# Patient Record
Sex: Female | Born: 1950
Health system: Southern US, Community
[De-identification: ages and names within clinical notes are randomized; demographics above are authoritative.]

## PROBLEM LIST (undated history)

## (undated) DIAGNOSIS — C50919 Malignant neoplasm of unspecified site of unspecified female breast: Secondary | ICD-10-CM

## (undated) DIAGNOSIS — S82132A Displaced fracture of medial condyle of left tibia, initial encounter for closed fracture: Secondary | ICD-10-CM

## (undated) DIAGNOSIS — M1990 Unspecified osteoarthritis, unspecified site: Secondary | ICD-10-CM

## (undated) DIAGNOSIS — I10 Essential (primary) hypertension: Secondary | ICD-10-CM

## (undated) DIAGNOSIS — Z972 Presence of dental prosthetic device (complete) (partial): Secondary | ICD-10-CM

## (undated) DIAGNOSIS — J449 Chronic obstructive pulmonary disease, unspecified: Secondary | ICD-10-CM

## (undated) DIAGNOSIS — K219 Gastro-esophageal reflux disease without esophagitis: Secondary | ICD-10-CM

## (undated) DIAGNOSIS — C801 Malignant (primary) neoplasm, unspecified: Secondary | ICD-10-CM

## (undated) DIAGNOSIS — E7800 Pure hypercholesterolemia, unspecified: Secondary | ICD-10-CM

## (undated) DIAGNOSIS — T8859XA Other complications of anesthesia, initial encounter: Secondary | ICD-10-CM

## (undated) HISTORY — PX: COLONOSCOPY: SHX174

## (undated) HISTORY — PX: CARPAL TUNNEL RELEASE: SHX101

## (undated) HISTORY — PX: TUBAL LIGATION: SHX77

## (undated) HISTORY — PX: THUMB FUSION: SUR636

---

## 1991-09-22 DIAGNOSIS — C50912 Malignant neoplasm of unspecified site of left female breast: Secondary | ICD-10-CM

## 1991-09-22 DIAGNOSIS — C801 Malignant (primary) neoplasm, unspecified: Secondary | ICD-10-CM

## 1991-09-22 HISTORY — PX: MASTECTOMY: SHX3

## 1991-09-22 HISTORY — DX: Malignant neoplasm of unspecified site of left female breast: C50.912

## 1991-09-22 HISTORY — DX: Malignant (primary) neoplasm, unspecified: C80.1

## 2004-07-16 ENCOUNTER — Ambulatory Visit: Payer: Self-pay | Admitting: Internal Medicine

## 2005-08-17 ENCOUNTER — Ambulatory Visit: Payer: Self-pay | Admitting: Internal Medicine

## 2006-08-20 ENCOUNTER — Ambulatory Visit: Payer: Self-pay | Admitting: Internal Medicine

## 2006-10-01 ENCOUNTER — Ambulatory Visit: Payer: Self-pay | Admitting: Gastroenterology

## 2007-08-17 ENCOUNTER — Ambulatory Visit: Payer: Self-pay | Admitting: Internal Medicine

## 2007-08-31 ENCOUNTER — Ambulatory Visit: Payer: Self-pay | Admitting: Internal Medicine

## 2007-10-17 ENCOUNTER — Ambulatory Visit: Payer: Self-pay | Admitting: Internal Medicine

## 2008-09-03 ENCOUNTER — Ambulatory Visit: Payer: Self-pay | Admitting: Internal Medicine

## 2009-10-16 ENCOUNTER — Ambulatory Visit: Payer: Self-pay | Admitting: Internal Medicine

## 2010-10-20 ENCOUNTER — Ambulatory Visit: Payer: Self-pay | Admitting: Internal Medicine

## 2011-09-02 ENCOUNTER — Ambulatory Visit: Payer: Self-pay | Admitting: Unknown Physician Specialty

## 2011-09-05 ENCOUNTER — Ambulatory Visit: Payer: Self-pay | Admitting: Unknown Physician Specialty

## 2011-10-22 ENCOUNTER — Ambulatory Visit: Payer: Self-pay | Admitting: Internal Medicine

## 2012-10-24 ENCOUNTER — Ambulatory Visit: Payer: Self-pay | Admitting: Internal Medicine

## 2013-06-15 ENCOUNTER — Ambulatory Visit: Payer: Self-pay | Admitting: Internal Medicine

## 2013-06-30 ENCOUNTER — Ambulatory Visit: Payer: Self-pay | Admitting: Gastroenterology

## 2013-10-25 ENCOUNTER — Ambulatory Visit: Payer: Self-pay | Admitting: Internal Medicine

## 2014-03-26 DIAGNOSIS — J449 Chronic obstructive pulmonary disease, unspecified: Secondary | ICD-10-CM | POA: Insufficient documentation

## 2014-10-26 ENCOUNTER — Ambulatory Visit: Payer: Self-pay | Admitting: Internal Medicine

## 2015-08-21 ENCOUNTER — Other Ambulatory Visit: Payer: Self-pay | Admitting: Internal Medicine

## 2015-08-21 DIAGNOSIS — Z1231 Encounter for screening mammogram for malignant neoplasm of breast: Secondary | ICD-10-CM

## 2015-10-28 ENCOUNTER — Other Ambulatory Visit: Payer: Self-pay | Admitting: Internal Medicine

## 2015-10-28 ENCOUNTER — Ambulatory Visit
Admission: RE | Admit: 2015-10-28 | Discharge: 2015-10-28 | Disposition: A | Discharge status: Home / Self Care | Payer: BLUE CROSS/BLUE SHIELD | Source: Ambulatory Visit | Attending: Internal Medicine | Admitting: Internal Medicine

## 2015-10-28 DIAGNOSIS — Z1231 Encounter for screening mammogram for malignant neoplasm of breast: Secondary | ICD-10-CM

## 2015-10-28 HISTORY — DX: Malignant neoplasm of unspecified site of unspecified female breast: C50.919

## 2015-10-28 HISTORY — DX: Malignant (primary) neoplasm, unspecified: C80.1

## 2016-01-17 ENCOUNTER — Encounter: Payer: Self-pay | Admitting: *Deleted

## 2016-01-17 NOTE — Discharge Instructions (Signed)

## 2016-01-22 ENCOUNTER — Ambulatory Visit
Admission: RE | Admit: 2016-01-22 | Discharge: 2016-01-22 | Disposition: A | Discharge status: Home / Self Care | Payer: BLUE CROSS/BLUE SHIELD | Source: Ambulatory Visit | Attending: Ophthalmology | Admitting: Ophthalmology

## 2016-01-22 ENCOUNTER — Ambulatory Visit: Payer: BLUE CROSS/BLUE SHIELD | Admitting: Anesthesiology

## 2016-01-22 ENCOUNTER — Encounter
Admission: RE | Disposition: A | Discharge status: Home / Self Care | Payer: Self-pay | Source: Ambulatory Visit | Attending: Ophthalmology

## 2016-01-22 DIAGNOSIS — K219 Gastro-esophageal reflux disease without esophagitis: Secondary | ICD-10-CM | POA: Insufficient documentation

## 2016-01-22 DIAGNOSIS — Z79899 Other long term (current) drug therapy: Secondary | ICD-10-CM | POA: Diagnosis not present

## 2016-01-22 DIAGNOSIS — Z87891 Personal history of nicotine dependence: Secondary | ICD-10-CM | POA: Insufficient documentation

## 2016-01-22 DIAGNOSIS — E7800 Pure hypercholesterolemia, unspecified: Secondary | ICD-10-CM | POA: Diagnosis not present

## 2016-01-22 DIAGNOSIS — H2511 Age-related nuclear cataract, right eye: Secondary | ICD-10-CM | POA: Diagnosis present

## 2016-01-22 DIAGNOSIS — Z853 Personal history of malignant neoplasm of breast: Secondary | ICD-10-CM | POA: Insufficient documentation

## 2016-01-22 DIAGNOSIS — J449 Chronic obstructive pulmonary disease, unspecified: Secondary | ICD-10-CM | POA: Insufficient documentation

## 2016-01-22 DIAGNOSIS — M1990 Unspecified osteoarthritis, unspecified site: Secondary | ICD-10-CM | POA: Diagnosis not present

## 2016-01-22 DIAGNOSIS — I10 Essential (primary) hypertension: Secondary | ICD-10-CM | POA: Insufficient documentation

## 2016-01-22 DIAGNOSIS — M810 Age-related osteoporosis without current pathological fracture: Secondary | ICD-10-CM | POA: Diagnosis not present

## 2016-01-22 DIAGNOSIS — Z888 Allergy status to other drugs, medicaments and biological substances status: Secondary | ICD-10-CM | POA: Insufficient documentation

## 2016-01-22 HISTORY — DX: Essential (primary) hypertension: I10

## 2016-01-22 HISTORY — DX: Presence of dental prosthetic device (complete) (partial): Z97.2

## 2016-01-22 HISTORY — DX: Unspecified osteoarthritis, unspecified site: M19.90

## 2016-01-22 HISTORY — DX: Pure hypercholesterolemia, unspecified: E78.00

## 2016-01-22 HISTORY — PX: CATARACT EXTRACTION W/PHACO: SHX586

## 2016-01-22 HISTORY — DX: Chronic obstructive pulmonary disease, unspecified: J44.9

## 2016-01-22 HISTORY — DX: Gastro-esophageal reflux disease without esophagitis: K21.9

## 2016-01-22 SURGERY — PHACOEMULSIFICATION, CATARACT, WITH IOL INSERTION
Anesthesia: Monitor Anesthesia Care | Site: Eye | Laterality: Right | Wound class: Clean

## 2016-01-22 MED ORDER — BSS IO SOLN
INTRAOCULAR | Status: DC | PRN
  Administered 2016-01-22 (×2): 500 mL via INTRAOCULAR

## 2016-01-22 MED ORDER — LIDOCAINE HCL (PF) 4 % IJ SOLN
INTRAOCULAR | Status: DC | PRN
  Administered 2016-01-22: 1 mL via OPHTHALMIC

## 2016-01-22 MED ORDER — POVIDONE-IODINE 5 % OP SOLN
1.0000 "application " | OPHTHALMIC | Status: DC | PRN
  Administered 2016-01-22: 1 via OPHTHALMIC

## 2016-01-22 MED ORDER — TIMOLOL MALEATE 0.5 % OP SOLN
OPHTHALMIC | Status: DC | PRN
  Administered 2016-01-22: 1 [drp] via OPHTHALMIC

## 2016-01-22 MED ORDER — ACETAMINOPHEN 160 MG/5ML PO SOLN: 325.0000 mg | ORAL | Status: DC | PRN

## 2016-01-22 MED ORDER — FENTANYL CITRATE (PF) 100 MCG/2ML IJ SOLN
INTRAMUSCULAR | Status: DC | PRN
  Administered 2016-01-22: 50 ug via INTRAVENOUS

## 2016-01-22 MED ORDER — ARMC OPHTHALMIC DILATING GEL
1.0000 "application " | OPHTHALMIC | Status: DC | PRN
  Administered 2016-01-22 (×2): 1 via OPHTHALMIC

## 2016-01-22 MED ORDER — MIDAZOLAM HCL 2 MG/2ML IJ SOLN
INTRAMUSCULAR | Status: DC | PRN
  Administered 2016-01-22: 2 mg via INTRAVENOUS

## 2016-01-22 MED ORDER — BRIMONIDINE TARTRATE 0.2 % OP SOLN
OPHTHALMIC | Status: DC | PRN
  Administered 2016-01-22: 1 [drp] via OPHTHALMIC

## 2016-01-22 MED ORDER — ACETAMINOPHEN 325 MG PO TABS: 325.0000 mg | ORAL_TABLET | ORAL | Status: DC | PRN

## 2016-01-22 MED ORDER — TETRACAINE HCL 0.5 % OP SOLN
1.0000 [drp] | OPHTHALMIC | Status: DC | PRN
  Administered 2016-01-22: 1 [drp] via OPHTHALMIC

## 2016-01-22 MED ORDER — LACTATED RINGERS IV SOLN: INTRAVENOUS | Status: DC

## 2016-01-22 MED ORDER — CEFUROXIME OPHTHALMIC INJECTION 1 MG/0.1 ML
INJECTION | OPHTHALMIC | Status: DC | PRN
  Administered 2016-01-22: 0.1 mL via INTRACAMERAL

## 2016-01-22 SURGICAL SUPPLY — 21 items
CANNULA ANT/CHMB 27GA (MISCELLANEOUS) ×3 IMPLANT
CARTRIDGE ABBOTT (MISCELLANEOUS) IMPLANT
GLOVE SURG LX 7.5 STRW (GLOVE) ×2
GLOVE SURG LX STRL 7.5 STRW (GLOVE) ×1 IMPLANT
GLOVE SURG TRIUMPH 8.0 PF LTX (GLOVE) ×3 IMPLANT
GOWN STRL REUS W/ TWL LRG LVL3 (GOWN DISPOSABLE) ×2 IMPLANT
GOWN STRL REUS W/TWL LRG LVL3 (GOWN DISPOSABLE) ×4
LENS IOL TECNIS ITEC 19.0 (Intraocular Lens) ×3 IMPLANT
MARKER SKIN DUAL TIP RULER LAB (MISCELLANEOUS) ×3 IMPLANT
NDL RETROBULBAR .5 NSTRL (NEEDLE) IMPLANT
PACK CATARACT BRASINGTON (MISCELLANEOUS) ×3 IMPLANT
PACK EYE AFTER SURG (MISCELLANEOUS) ×3 IMPLANT
PACK OPTHALMIC (MISCELLANEOUS) ×3 IMPLANT
RING MALYGIN 7.0 (MISCELLANEOUS) IMPLANT
SUT ETHILON 10-0 CS-B-6CS-B-6 (SUTURE)
SUT VICRYL  9 0 (SUTURE)
SUT VICRYL 9 0 (SUTURE) IMPLANT
SUTURE EHLN 10-0 CS-B-6CS-B-6 (SUTURE) IMPLANT
SYR TB 1ML LUER SLIP (SYRINGE) ×3 IMPLANT
WATER STERILE IRR 250ML POUR (IV SOLUTION) ×3 IMPLANT
WIPE NON LINTING 3.25X3.25 (MISCELLANEOUS) ×3 IMPLANT

## 2016-01-22 NOTE — Anesthesia Postprocedure Evaluation (Signed)
Anesthesia Post Note  Patient: Lindsey Mccall  Procedure(s) Performed: Procedure(s) (LRB): CATARACT EXTRACTION PHACO AND INTRAOCULAR LENS PLACEMENT (IOC) right eye (Right)  Patient location during evaluation: PACU Anesthesia Type: MAC Level of consciousness: awake and alert and oriented Pain management: satisfactory to patient Vital Signs Assessment: post-procedure vital signs reviewed and stable Respiratory status: spontaneous breathing, nonlabored ventilation and respiratory function stable Cardiovascular status: blood pressure returned to baseline and stable Postop Assessment: Adequate PO intake and No signs of nausea or vomiting Anesthetic complications: no    Raliegh Ip

## 2016-01-22 NOTE — Anesthesia Procedure Notes (Signed)
Procedure Name: MAC Performed by: Jonesha Tsuchiya Pre-anesthesia Checklist: Patient identified, Emergency Drugs available, Suction available, Timeout performed and Patient being monitored Patient Re-evaluated:Patient Re-evaluated prior to inductionOxygen Delivery Method: Nasal cannula Placement Confirmation: positive ETCO2     

## 2016-01-22 NOTE — Transfer of Care (Signed)
Immediate Anesthesia Transfer of Care Note  Patient: Lindsey Mccall  Procedure(s) Performed: Procedure(s): CATARACT EXTRACTION PHACO AND INTRAOCULAR LENS PLACEMENT (IOC) right eye (Right)  Patient Location: PACU  Anesthesia Type: MAC  Level of Consciousness: awake, alert  and patient cooperative  Airway and Oxygen Therapy: Patient Spontanous Breathing and Patient connected to supplemental oxygen  Post-op Assessment: Post-op Vital signs reviewed, Patient's Cardiovascular Status Stable, Respiratory Function Stable, Patent Airway and No signs of Nausea or vomiting  Post-op Vital Signs: Reviewed and stable  Complications: No apparent anesthesia complications

## 2016-01-22 NOTE — Anesthesia Preprocedure Evaluation (Signed)
Anesthesia Evaluation  Patient identified by MRN, date of birth, ID band  Reviewed: Allergy & Precautions, H&P , NPO status , Patient's Chart, lab work & pertinent test results  Airway Mallampati: II  TM Distance: >3 FB Neck ROM: full    Dental no notable dental hx. (+) Upper Dentures   Pulmonary neg shortness of breath, COPD, former smoker,    Pulmonary exam normal        Cardiovascular hypertension,  Rhythm:regular Rate:Normal     Neuro/Psych    GI/Hepatic GERD  ,  Endo/Other    Renal/GU      Musculoskeletal   Abdominal   Peds  Hematology   Anesthesia Other Findings   Reproductive/Obstetrics                             Anesthesia Physical Anesthesia Plan  ASA: II  Anesthesia Plan: MAC   Post-op Pain Management:    Induction:   Airway Management Planned:   Additional Equipment:   Intra-op Plan:   Post-operative Plan:   Informed Consent: I have reviewed the patients History and Physical, chart, labs and discussed the procedure including the risks, benefits and alternatives for the proposed anesthesia with the patient or authorized representative who has indicated his/her understanding and acceptance.     Plan Discussed with: CRNA  Anesthesia Plan Comments:         Anesthesia Quick Evaluation

## 2016-01-22 NOTE — Op Note (Signed)
LOCATION:  [facility]   PREOPERATIVE DIAGNOSIS:    Nuclear sclerotic cataract right eye. H25.11   POSTOPERATIVE DIAGNOSIS:  Nuclear sclerotic cataract right eye.     PROCEDURE:  Phacoemusification with posterior chamber intraocular lens placement of the right eye   LENS:   Implant Name Type Inv. Item Serial No. Manufacturer Lot No. LRB No. Used  LENS IOL DIOP 19.0 - [id number] Intraocular Lens LENS IOL DIOP 19.0 [id number]   Right 1        ULTRASOUND TIME: 16 % of 1 minutes, 12 seconds.  CDE 11.9   SURGEON:  Wyonia Hough, MD   ANESTHESIA:  Topical with tetracaine drops and 2% Xylocaine jelly, augmented with 1% preservative-free intracameral lidocaine.    COMPLICATIONS:  None.   DESCRIPTION OF PROCEDURE:  The patient was identified in the holding room and transported to the operating room and placed in the supine position under the operating microscope.  The right eye was identified as the operative eye and it was prepped and draped in the usual sterile ophthalmic fashion.   A 1 millimeter clear-corneal paracentesis was made at the 12:00 position.  0.5 ml of preservative-free 1% lidocaine was injected into the anterior chamber. The anterior chamber was filled with Viscoat viscoelastic.  A 2.4 millimeter keratome was used to make a near-clear corneal incision at the 9:00 position.  A curvilinear capsulorrhexis was made with a cystotome and capsulorrhexis forceps.  Balanced salt solution was used to hydrodissect and hydrodelineate the nucleus.   Phacoemulsification was then used in stop and chop fashion to remove the lens nucleus and epinucleus.  The remaining cortex was then removed using the irrigation and aspiration handpiece. Provisc was then placed into the capsular bag to distend it for lens placement.  A lens was then injected into the capsular bag.  The remaining viscoelastic was aspirated.   Wounds were hydrated with balanced salt solution.  The anterior  chamber was inflated to a physiologic pressure with balanced salt solution.  No wound leaks were noted. Cefuroxime 0.1 ml of a 10mg /ml solution was injected into the anterior chamber for a dose of 1 mg of intracameral antibiotic at the completion of the case.   Timolol and Brimonidine drops were applied to the eye.  The patient was taken to the recovery room in stable condition without complications of anesthesia or surgery.   Decari Duggar 01/22/2016, 11:51 AM

## 2016-01-22 NOTE — H&P (Signed)
  The History and Physical notes are on paper, have been signed, and are to be scanned. The patient remains stable and unchanged from the H&P.   Previous H&P reviewed, patient examined, and there are no changes.  Lindsey Mccall 01/22/2016 11:03 AM

## 2016-01-23 ENCOUNTER — Encounter: Payer: Self-pay | Admitting: Ophthalmology

## 2016-10-07 ENCOUNTER — Other Ambulatory Visit: Payer: Self-pay | Admitting: Internal Medicine

## 2016-10-07 DIAGNOSIS — Z1231 Encounter for screening mammogram for malignant neoplasm of breast: Secondary | ICD-10-CM

## 2016-11-05 ENCOUNTER — Other Ambulatory Visit: Payer: Self-pay | Admitting: Internal Medicine

## 2016-11-05 DIAGNOSIS — R05 Cough: Secondary | ICD-10-CM

## 2016-11-05 DIAGNOSIS — R053 Chronic cough: Secondary | ICD-10-CM

## 2016-11-09 ENCOUNTER — Ambulatory Visit
Admission: RE | Admit: 2016-11-09 | Discharge: 2016-11-09 | Disposition: A | Discharge status: Home / Self Care | Payer: BLUE CROSS/BLUE SHIELD | Source: Ambulatory Visit | Attending: Internal Medicine | Admitting: Internal Medicine

## 2016-11-09 DIAGNOSIS — Z1231 Encounter for screening mammogram for malignant neoplasm of breast: Secondary | ICD-10-CM | POA: Insufficient documentation

## 2016-11-17 ENCOUNTER — Ambulatory Visit
Admission: RE | Admit: 2016-11-17 | Discharge: 2016-11-17 | Disposition: A | Discharge status: Home / Self Care | Payer: BLUE CROSS/BLUE SHIELD | Source: Ambulatory Visit | Attending: Internal Medicine | Admitting: Internal Medicine

## 2016-11-17 DIAGNOSIS — R918 Other nonspecific abnormal finding of lung field: Secondary | ICD-10-CM | POA: Diagnosis not present

## 2016-11-17 DIAGNOSIS — J439 Emphysema, unspecified: Secondary | ICD-10-CM | POA: Diagnosis not present

## 2016-11-17 DIAGNOSIS — R05 Cough: Secondary | ICD-10-CM

## 2016-11-17 DIAGNOSIS — R053 Chronic cough: Secondary | ICD-10-CM

## 2017-05-05 ENCOUNTER — Other Ambulatory Visit: Payer: Self-pay | Admitting: Internal Medicine

## 2017-05-05 DIAGNOSIS — R911 Solitary pulmonary nodule: Secondary | ICD-10-CM

## 2017-05-12 ENCOUNTER — Ambulatory Visit
Admission: RE | Admit: 2017-05-12 | Discharge: 2017-05-12 | Disposition: A | Discharge status: Home / Self Care | Payer: BLUE CROSS/BLUE SHIELD | Source: Ambulatory Visit | Attending: Internal Medicine | Admitting: Internal Medicine

## 2017-05-12 DIAGNOSIS — R918 Other nonspecific abnormal finding of lung field: Secondary | ICD-10-CM | POA: Diagnosis not present

## 2017-05-12 DIAGNOSIS — D3501 Benign neoplasm of right adrenal gland: Secondary | ICD-10-CM | POA: Diagnosis not present

## 2017-05-12 DIAGNOSIS — R911 Solitary pulmonary nodule: Secondary | ICD-10-CM | POA: Diagnosis present

## 2017-05-12 DIAGNOSIS — D3502 Benign neoplasm of left adrenal gland: Secondary | ICD-10-CM | POA: Diagnosis not present

## 2017-05-17 ENCOUNTER — Other Ambulatory Visit: Payer: Self-pay | Admitting: Internal Medicine

## 2017-05-17 DIAGNOSIS — R918 Other nonspecific abnormal finding of lung field: Secondary | ICD-10-CM

## 2017-05-21 ENCOUNTER — Encounter: Payer: Self-pay | Admitting: *Deleted

## 2017-05-21 ENCOUNTER — Ambulatory Visit (INDEPENDENT_AMBULATORY_CARE_PROVIDER_SITE_OTHER): Payer: BLUE CROSS/BLUE SHIELD | Admitting: Pulmonary Disease

## 2017-05-21 ENCOUNTER — Encounter: Payer: Self-pay | Admitting: Pulmonary Disease

## 2017-05-21 VITALS — BP 126/88 | HR 96 | Ht 61.0 in | Wt 107.0 lb

## 2017-05-21 DIAGNOSIS — Z87891 Personal history of nicotine dependence: Secondary | ICD-10-CM | POA: Diagnosis not present

## 2017-05-21 DIAGNOSIS — R918 Other nonspecific abnormal finding of lung field: Secondary | ICD-10-CM

## 2017-05-21 DIAGNOSIS — J439 Emphysema, unspecified: Secondary | ICD-10-CM

## 2017-05-21 NOTE — Patient Instructions (Signed)
Repeat CT chest in 3 months and follow-up with me after that to review and decide further management

## 2017-05-25 NOTE — Progress Notes (Signed)
PULMONARY CONSULT NOTE  Requesting MD/Service: Doy Hutching Date of initial consultation: 05/21/17 Reason for consultation: Multiple pulmonary nodules  PT PROFILE: 66 y.o. female former smoker (30 pack year, quit 18 years ago) with prior history of breast cancer underwent CT chest 11/17/16 for evaluation of cough and follow-up CT 05/12/17 with finding of multiple pulmonary nodules. Therefore referred to pulmonary medicine for further evaluation.  11/17/16 CT chest: Moderate emphysema. 2. Lingular ground-glass opacity with somewhat nodular appearance, up to 12 mm diameter. If there is outside prior chest CT, comparison can be made and this report addended. Otherwise, recommend noncontrast CT at 6-12 months to confirm persistence 05/12/17 CT chest: Resolution of ground-glass opacity seen on the prior CT examination. New bilateral pulmonary nodules. These are somewhat suspicious for metastatic disease. Recommend short-term followup noncontrast chest CT in 3 months.   HPI:  As above. She has mild cough but otherwise denies pulmonary and respiratory symptoms. Her cough is nonproductive. She's never had hemoptysis. She is not on inhalers and has never used them in the past. She denies exertional dyspnea. She has had mild weight loss of 7 pounds over many months. She is a former smoker as noted above.  Past Medical History:  Diagnosis Date  . Breast cancer ([place])    Left  . Cancer [facility]) 1993   Left breast  . COPD (chronic obstructive pulmonary disease) ([place])   . GERD (gastroesophageal reflux disease)   . Hypercholesteremia   . Hypertension   . Osteoarthritis    hands  . Wears dentures    full upper, implants lower    Past Surgical History:  Procedure Laterality Date  . CARPAL TUNNEL RELEASE Right   . CATARACT EXTRACTION W/PHACO Right 01/22/2016   Procedure: CATARACT EXTRACTION PHACO AND INTRAOCULAR LENS PLACEMENT ([place]) right eye;  Surgeon: Leandrew Koyanagi, MD;  Location: [place];   Service: Ophthalmology;  Laterality: Right;  . COLONOSCOPY    . MASTECTOMY Left 1993  . THUMB FUSION Bilateral   . TUBAL LIGATION      MEDICATIONS: I have reviewed all medications and confirmed regimen as documented  Social History   Social History  . Marital status: Married    Spouse name: N/A  . Number of children: N/A  . Years of education: N/A   Occupational History  . Not on file.   Social History Main Topics  . Smoking status: Former Smoker    Types: Cigarettes    Quit date: 09/22/1999  . Smokeless tobacco: Never Used  . Alcohol use 1.8 oz/week    3 Cans of beer per week  . Drug use: Unknown  . Sexual activity: Not on file   Other Topics Concern  . Not on file   Social History Narrative  . No narrative on file    History reviewed. No pertinent family history.  ROS: No fever, myalgias/arthralgias, unexplained weight loss or weight gain No new focal weakness or sensory deficits No otalgia, hearing loss, visual changes, nasal and sinus symptoms, mouth and throat problems No neck pain or adenopathy No abdominal pain, N/V/D, diarrhea, change in bowel pattern No dysuria, change in urinary pattern   Vitals:   05/21/17 0954 05/21/17 0959  BP:  126/88  Pulse:  96  SpO2:  94%  Weight: 48.5 kg (107 lb)   Height: _0  (1.549 m)      EXAM:   Gen: WDWN in NAD HEENT: NCAT, sclerae white, oropharynx normal Neck: No LAN, no JVD noted Lungs: full BS, normal  percussion note, no wheezes or other adventitious sounds Cardiovascular: Normal rate, regular rhythm, no M noted Abdomen: Soft, NT, +BS Ext: no C/C/E Neuro: PERRL, EOMI, motor/sensory grossly intact Skin: No lesions noted   DATA:   No flowsheet data found.  No flowsheet data found.  CXR:  None available  IMPRESSION:     ICD-10-CM   1. Former smoker Z87.891   2. Mild emphysema incidentally found on CT chest - asymptomatic J43.9   3. Multiple pulmonary nodules - very remote history of breast ca..  Doubt recurrence. Concern for an otherwise undetected new malignancy vs a low-grade granulomatous infection such as MAC R91.8   4. Abnormal findings on diagnostic imaging of lung R91.8 CT CHEST WO CONTRAST   The above mentioned pulmonary nodules are not easily accessible for biopsy, either bronchoscopically or using a transthoracic approach. They are too small to assess with PET scan.  PLAN:  Repeat CT chest in 3 months and follow-up with me after that to review and decide further management   Merton Border, MD PCCM service Mobile [phone] Pager [phone] 05/25/2017 12:05 PM

## 2017-07-21 DIAGNOSIS — M7062 Trochanteric bursitis, left hip: Secondary | ICD-10-CM | POA: Diagnosis not present

## 2017-07-21 DIAGNOSIS — M79644 Pain in right finger(s): Secondary | ICD-10-CM | POA: Diagnosis not present

## 2017-07-21 DIAGNOSIS — M7061 Trochanteric bursitis, right hip: Secondary | ICD-10-CM | POA: Diagnosis not present

## 2017-07-21 DIAGNOSIS — G8929 Other chronic pain: Secondary | ICD-10-CM | POA: Insufficient documentation

## 2017-07-21 DIAGNOSIS — M159 Polyosteoarthritis, unspecified: Secondary | ICD-10-CM | POA: Diagnosis not present

## 2017-08-04 DIAGNOSIS — I10 Essential (primary) hypertension: Secondary | ICD-10-CM | POA: Diagnosis not present

## 2017-08-04 DIAGNOSIS — Z79899 Other long term (current) drug therapy: Secondary | ICD-10-CM | POA: Diagnosis not present

## 2017-08-04 DIAGNOSIS — J431 Panlobular emphysema: Secondary | ICD-10-CM | POA: Diagnosis not present

## 2017-08-04 DIAGNOSIS — R7309 Other abnormal glucose: Secondary | ICD-10-CM | POA: Diagnosis not present

## 2017-08-04 DIAGNOSIS — D649 Anemia, unspecified: Secondary | ICD-10-CM | POA: Diagnosis not present

## 2017-08-04 DIAGNOSIS — E7800 Pure hypercholesterolemia, unspecified: Secondary | ICD-10-CM | POA: Diagnosis not present

## 2017-08-17 DIAGNOSIS — R69 Illness, unspecified: Secondary | ICD-10-CM | POA: Diagnosis not present

## 2017-08-23 DIAGNOSIS — C50112 Malignant neoplasm of central portion of left female breast: Secondary | ICD-10-CM | POA: Diagnosis not present

## 2017-08-23 DIAGNOSIS — Z4432 Encounter for fitting and adjustment of external left breast prosthesis: Secondary | ICD-10-CM | POA: Diagnosis not present

## 2017-09-01 ENCOUNTER — Ambulatory Visit: Payer: Medicare HMO

## 2017-09-08 ENCOUNTER — Ambulatory Visit
Admission: RE | Admit: 2017-09-08 | Discharge: 2017-09-08 | Disposition: A | Discharge status: Home / Self Care | Payer: Medicare HMO | Source: Ambulatory Visit | Attending: Pulmonary Disease | Admitting: Pulmonary Disease

## 2017-09-08 DIAGNOSIS — D3501 Benign neoplasm of right adrenal gland: Secondary | ICD-10-CM | POA: Diagnosis not present

## 2017-09-08 DIAGNOSIS — I700 Atherosclerosis of aorta: Secondary | ICD-10-CM | POA: Insufficient documentation

## 2017-09-08 DIAGNOSIS — I2510 Atherosclerotic heart disease of native coronary artery without angina pectoris: Secondary | ICD-10-CM | POA: Diagnosis not present

## 2017-09-08 DIAGNOSIS — Z9012 Acquired absence of left breast and nipple: Secondary | ICD-10-CM | POA: Insufficient documentation

## 2017-09-08 DIAGNOSIS — R918 Other nonspecific abnormal finding of lung field: Secondary | ICD-10-CM | POA: Diagnosis not present

## 2017-09-27 ENCOUNTER — Ambulatory Visit: Payer: Medicare HMO | Admitting: Pulmonary Disease

## 2017-09-27 ENCOUNTER — Encounter: Payer: Self-pay | Admitting: Pulmonary Disease

## 2017-09-27 VITALS — BP 150/86 | HR 79 | Ht 61.0 in | Wt 109.0 lb

## 2017-09-27 DIAGNOSIS — R918 Other nonspecific abnormal finding of lung field: Secondary | ICD-10-CM

## 2017-09-27 NOTE — Progress Notes (Signed)
PULMONARY CONSULT NOTE  Requesting MD/Service: Doy Hutching Date of initial consultation: 05/21/17 Reason for consultation: Multiple pulmonary nodules  PT PROFILE: 67 y.o. female former smoker (30 pack year, quit 18 years ago) with prior history of breast cancer underwent CT chest 11/17/16 for evaluation of cough and follow-up CT 05/12/17 with finding of multiple pulmonary nodules. Therefore referred to pulmonary medicine for further evaluation.  11/17/16 CT chest: Moderate emphysema. 2. Lingular ground-glass opacity with somewhat nodular appearance, up to 12 mm diameter. If there is outside prior chest CT, comparison can be made and this report addended. Otherwise, recommend noncontrast CT at 6-12 months to confirm persistence 05/12/17 CT chest: Resolution of ground-glass opacity seen on the prior CT examination. New bilateral pulmonary nodules. These are somewhat suspicious for metastatic disease. Recommend short-term followup noncontrast chest CT in 3 months. 09/08/17 CT chest: Previously identified B upper lobe pulmonary nodules decreased or resolved compared to the prior study   SUBJ: I feel great. No respiratory or pulmonary complaints. Here to review recent repeat CT chest.    Vitals:   09/27/17 1048 09/27/17 1052  BP:  (!) 150/86  Pulse:  79  SpO2:  96%  Weight: 49.4 kg (109 lb)   Height: 5\' 1"  (1.549 m)      EXAM:  Gen: NAD HEENT: NCAT, sclera white Neck: No JVD Lungs: breath sounds full, no wheezes or other adventitious sounds Cardiovascular: RRR, no murmurs Abdomen: Soft, nontender, normal BS Ext: without clubbing, cyanosis, edema Neuro: grossly intact Skin: Limited exam, no lesions noted   DATA:   No flowsheet data found.  No flowsheet data found.  CXR:  None available  IMPRESSION:     ICD-10-CM   1. Pulmonary nodules, radiographically resolved R91.8     PLAN:  Follow up as needed or at the discretion of Dr Dietrich Pates, MD PCCM  service Mobile [phone] Pager [phone] 09/27/2017 11:22 AM

## 2017-09-27 NOTE — Patient Instructions (Signed)
No further follow-up necessary We will be happy to see you at any time for any pulmonary or breathing issues

## 2017-10-07 ENCOUNTER — Other Ambulatory Visit: Payer: Self-pay | Admitting: Internal Medicine

## 2017-10-07 DIAGNOSIS — Z1231 Encounter for screening mammogram for malignant neoplasm of breast: Secondary | ICD-10-CM

## 2017-11-09 DIAGNOSIS — J431 Panlobular emphysema: Secondary | ICD-10-CM | POA: Diagnosis not present

## 2017-11-09 DIAGNOSIS — Z0000 Encounter for general adult medical examination without abnormal findings: Secondary | ICD-10-CM | POA: Diagnosis not present

## 2017-11-09 DIAGNOSIS — D649 Anemia, unspecified: Secondary | ICD-10-CM | POA: Diagnosis not present

## 2017-11-09 DIAGNOSIS — Z79899 Other long term (current) drug therapy: Secondary | ICD-10-CM | POA: Diagnosis not present

## 2017-11-09 DIAGNOSIS — E7800 Pure hypercholesterolemia, unspecified: Secondary | ICD-10-CM | POA: Diagnosis not present

## 2017-11-09 DIAGNOSIS — I10 Essential (primary) hypertension: Secondary | ICD-10-CM | POA: Diagnosis not present

## 2017-11-11 ENCOUNTER — Ambulatory Visit
Admission: RE | Admit: 2017-11-11 | Discharge: 2017-11-11 | Disposition: A | Discharge status: Home / Self Care | Payer: Medicare HMO | Source: Ambulatory Visit | Attending: Internal Medicine | Admitting: Internal Medicine

## 2017-11-11 DIAGNOSIS — Z1231 Encounter for screening mammogram for malignant neoplasm of breast: Secondary | ICD-10-CM | POA: Diagnosis not present

## 2017-11-16 DIAGNOSIS — E7800 Pure hypercholesterolemia, unspecified: Secondary | ICD-10-CM | POA: Diagnosis not present

## 2017-11-16 DIAGNOSIS — Z79899 Other long term (current) drug therapy: Secondary | ICD-10-CM | POA: Diagnosis not present

## 2017-11-16 DIAGNOSIS — I10 Essential (primary) hypertension: Secondary | ICD-10-CM | POA: Diagnosis not present

## 2018-02-17 DIAGNOSIS — M159 Polyosteoarthritis, unspecified: Secondary | ICD-10-CM | POA: Diagnosis not present

## 2018-02-17 DIAGNOSIS — M19041 Primary osteoarthritis, right hand: Secondary | ICD-10-CM | POA: Insufficient documentation

## 2018-02-17 DIAGNOSIS — M7061 Trochanteric bursitis, right hip: Secondary | ICD-10-CM | POA: Insufficient documentation

## 2018-05-09 DIAGNOSIS — H2512 Age-related nuclear cataract, left eye: Secondary | ICD-10-CM | POA: Diagnosis not present

## 2018-05-16 DIAGNOSIS — E7800 Pure hypercholesterolemia, unspecified: Secondary | ICD-10-CM | POA: Diagnosis not present

## 2018-05-16 DIAGNOSIS — I10 Essential (primary) hypertension: Secondary | ICD-10-CM | POA: Diagnosis not present

## 2018-05-16 DIAGNOSIS — J431 Panlobular emphysema: Secondary | ICD-10-CM | POA: Diagnosis not present

## 2018-05-16 DIAGNOSIS — Z79899 Other long term (current) drug therapy: Secondary | ICD-10-CM | POA: Diagnosis not present

## 2018-05-16 DIAGNOSIS — N390 Urinary tract infection, site not specified: Secondary | ICD-10-CM | POA: Diagnosis not present

## 2018-08-17 DIAGNOSIS — M7061 Trochanteric bursitis, right hip: Secondary | ICD-10-CM | POA: Diagnosis not present

## 2018-08-17 DIAGNOSIS — M19041 Primary osteoarthritis, right hand: Secondary | ICD-10-CM | POA: Diagnosis not present

## 2018-08-17 DIAGNOSIS — M19042 Primary osteoarthritis, left hand: Secondary | ICD-10-CM | POA: Diagnosis not present

## 2018-10-05 ENCOUNTER — Other Ambulatory Visit: Payer: Self-pay | Admitting: Internal Medicine

## 2018-10-05 DIAGNOSIS — Z1231 Encounter for screening mammogram for malignant neoplasm of breast: Secondary | ICD-10-CM

## 2018-11-14 ENCOUNTER — Ambulatory Visit
Admission: RE | Admit: 2018-11-14 | Discharge: 2018-11-14 | Disposition: A | Discharge status: Home / Self Care | Payer: Medicare HMO | Source: Ambulatory Visit | Attending: Internal Medicine | Admitting: Internal Medicine

## 2018-11-14 DIAGNOSIS — Z1231 Encounter for screening mammogram for malignant neoplasm of breast: Secondary | ICD-10-CM | POA: Diagnosis not present

## 2019-01-04 IMAGING — CT CT CHEST W/O CM
1 series · 15 of 34 positions shown, 19 images · non-contrast
Comparison: None.

CLINICAL DATA: Chronic cough for 1 year

EXAM:
CT CHEST WITHOUT CONTRAST
TECHNIQUE: Multidetector CT imaging of the chest was performed following the
standard protocol without IV contrast.

[Series 2: thorax · axial · 0.61mm/px · z∈[-593,-363]mm · 15 of 137 slices shown, 19 images]
[im 11/137  mediastinal]
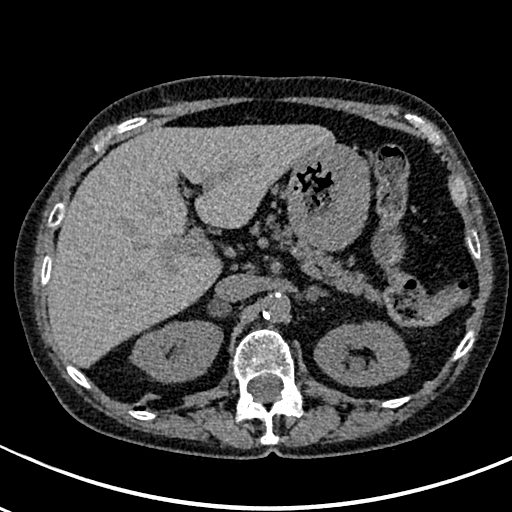
[im 11/137  lung]
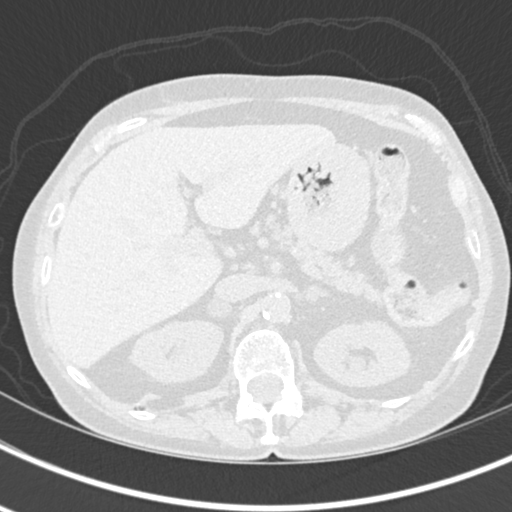
[im 21/137  lung]
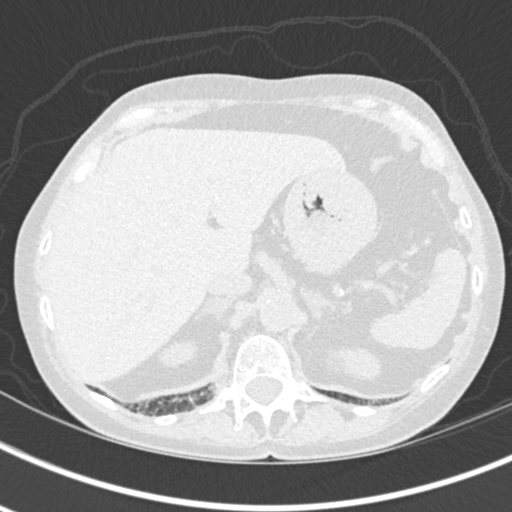
[im 28/137  lung]
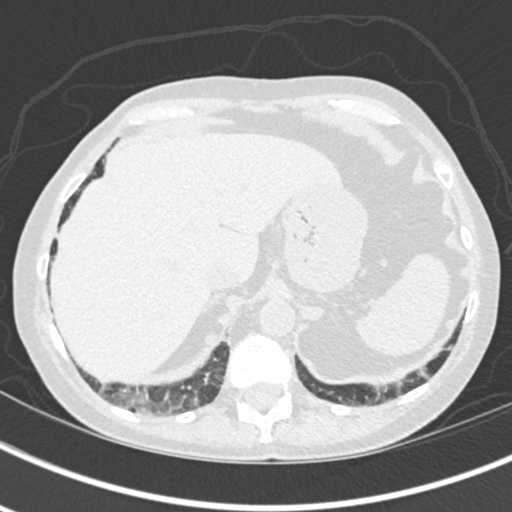
[im 36/137  lung]
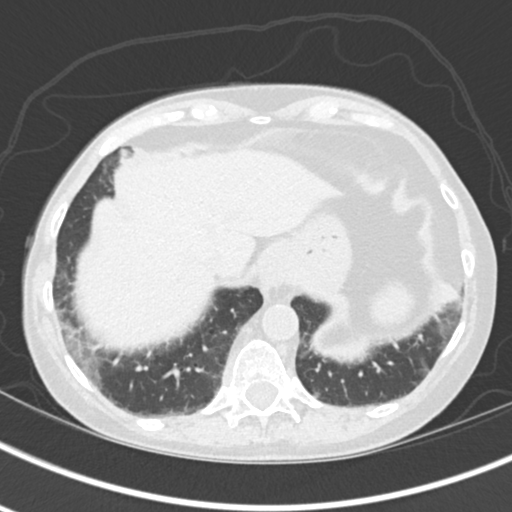
[im 46/137  mediastinal]
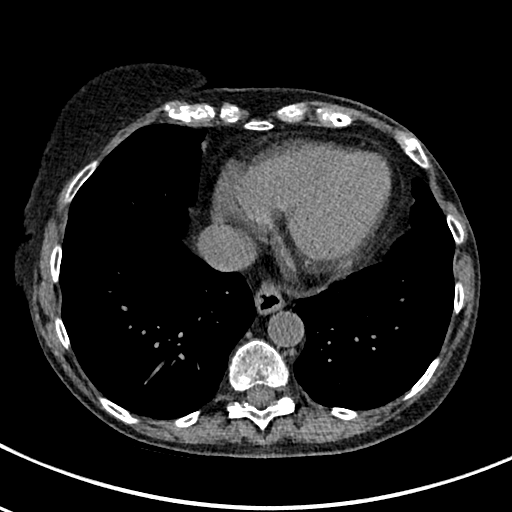
[im 46/137  lung]
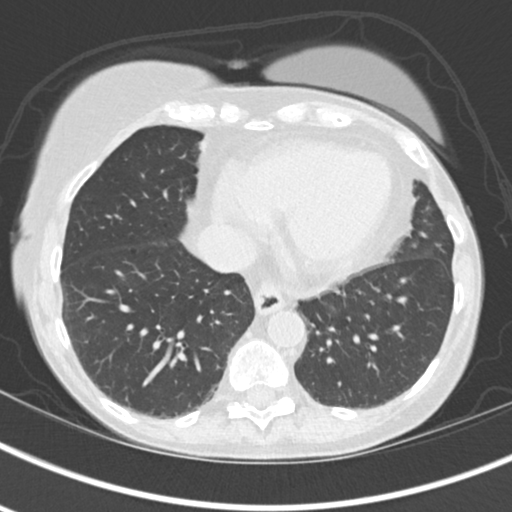
[im 55/137  lung]
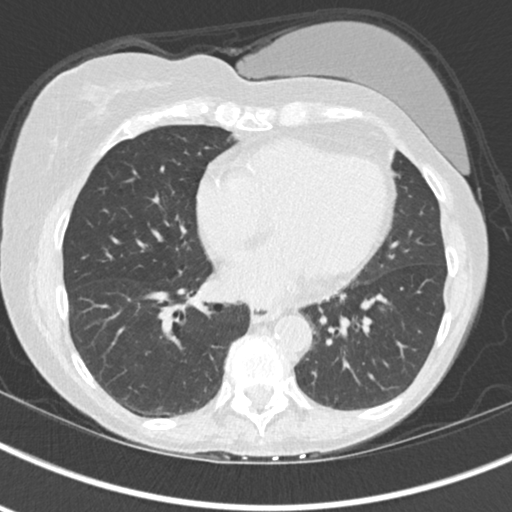
[im 61/137  lung]
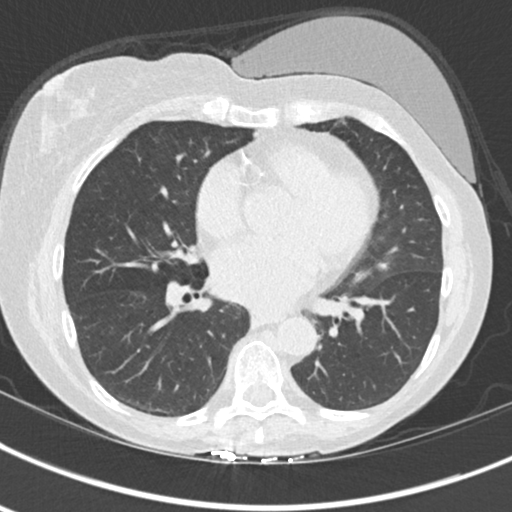
[im 71/137  lung]
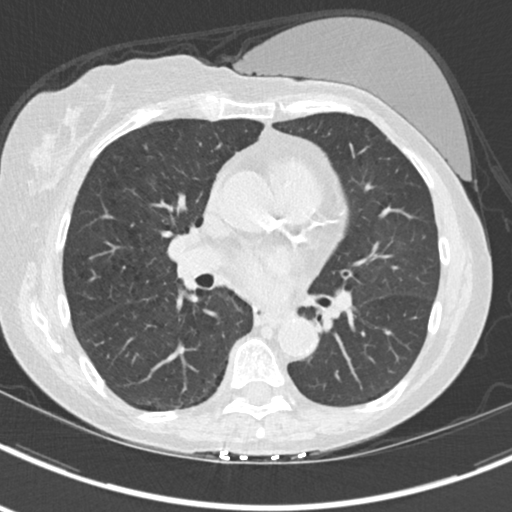
[im 76/137  mediastinal]
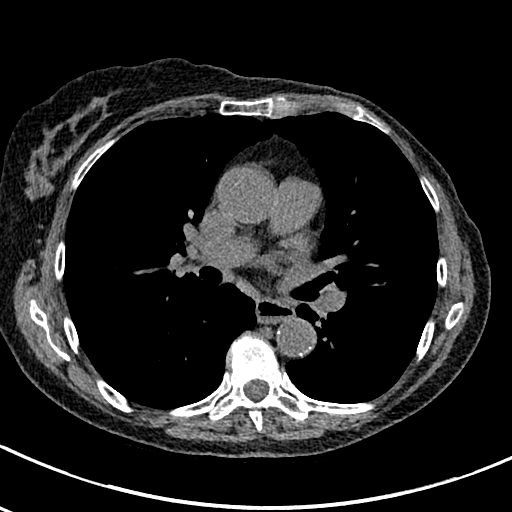
[im 76/137  lung]
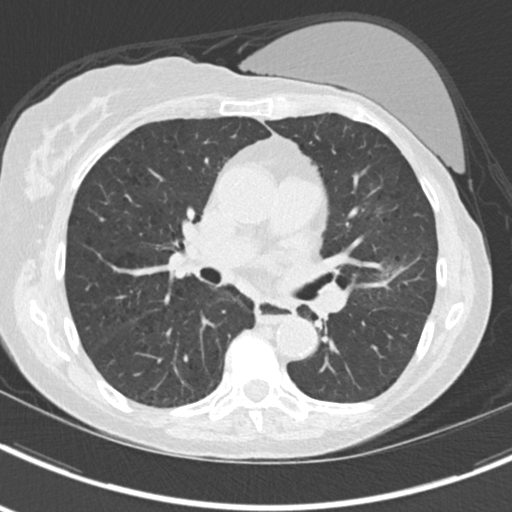
[im 82/137  lung]
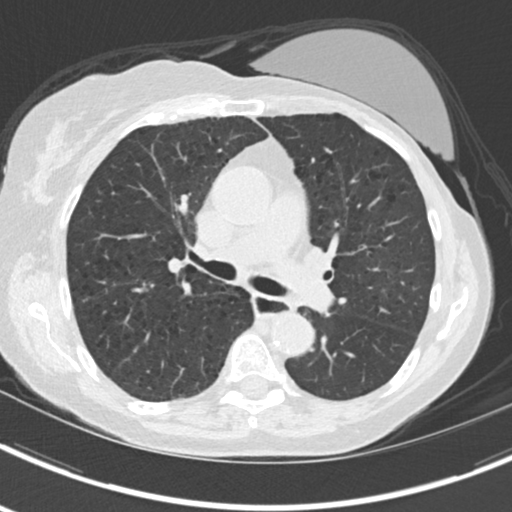
[im 91/137  lung]
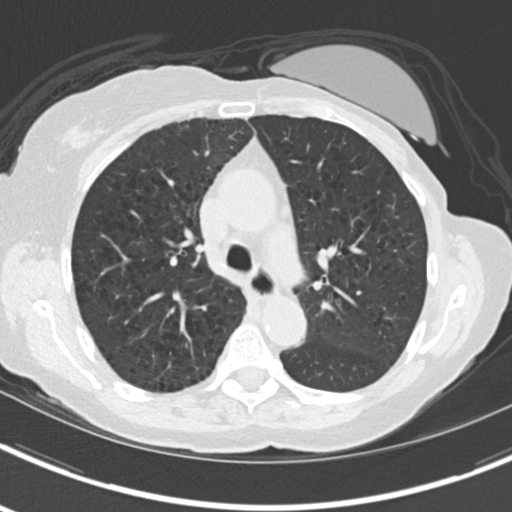
[im 101/137  lung]
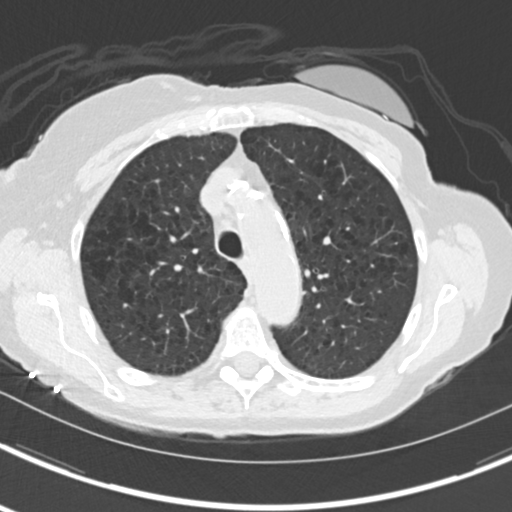
[im 109/137  mediastinal]
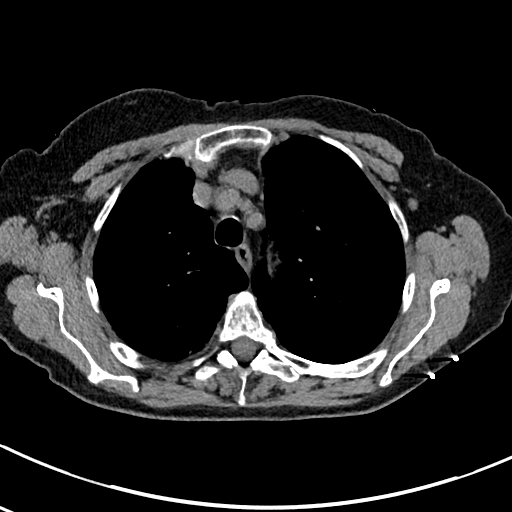
[im 109/137  lung]
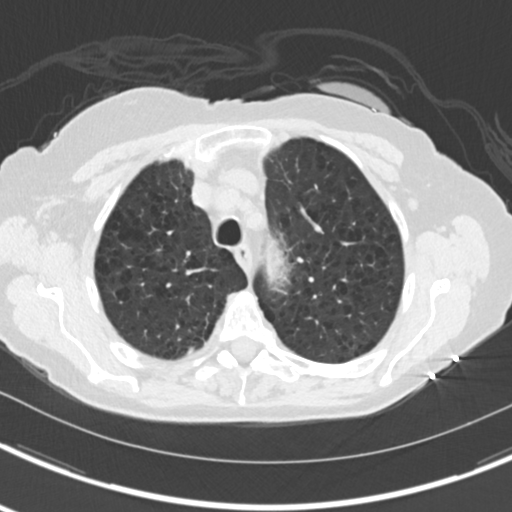
[im 116/137  lung]
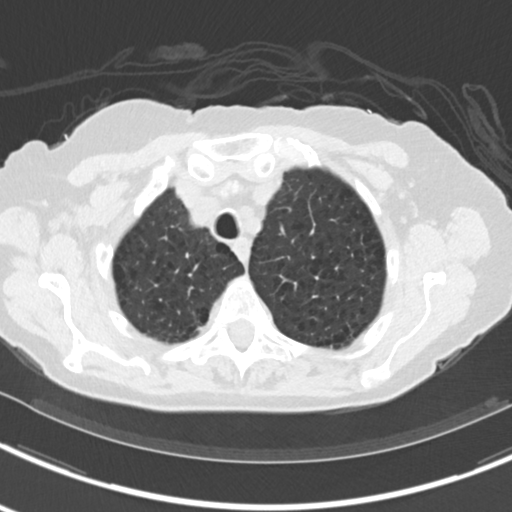
[im 126/137  lung]
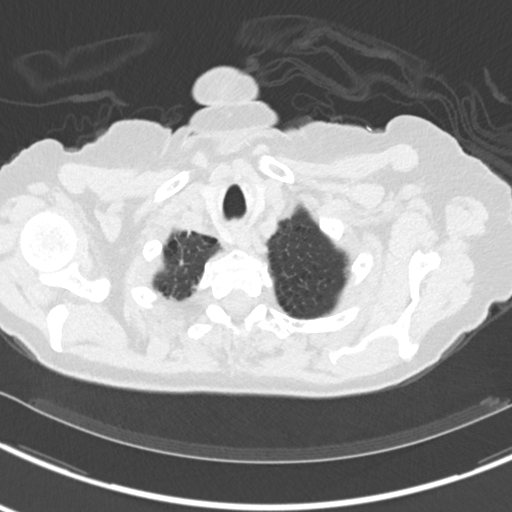

[15 of 34 positions shown; findings below may reference images not displayed]

FINDINGS: Cardiovascular: Normal heart size. No pericardial effusion.
Atherosclerosis, including the coronary arteries.

Mediastinum/Nodes: Small fatty hiatal hernia.

Left mastectomy.  Negative for adenopathy.

Lungs/Pleura: Moderate centrilobular emphysema with hyperinflation.
There is no edema, consolidation, effusion, or pneumothorax. Mild
atelectasis along the bilateral diaphragm. There is indistinct
patchy ground-glass density in the lingula, with the largest
discrete area measuring up to 12 mm. 2 mm average diameter
subpleural nodule the left upper lobe, [DATE], likely a lymph node.

Upper Abdomen: Bilateral adrenal nodules, measuring up to 19 mm on
the right. Densitometry consistent with adenomas. Prominent
appearance of the upper pole collecting system on the right is
attributed to patulous compound calyx given CT appearance in 8454.
No suspected interval hydronephrosis.

Musculoskeletal: No acute or aggressive finding
IMPRESSION: 1. Moderate emphysema.
2. Lingular ground-glass opacity with somewhat nodular appearance,
up to 12 mm diameter. If there is outside prior chest CT, comparison
can be made and this report addended. Otherwise, recommend
noncontrast CT at 6-12 months to confirm persistence. This
recommendation follows the consensus statement: Guidelines for
Management of Incidental Pulmonary Nodules Detected on CT Images:

## 2019-05-11 DIAGNOSIS — E7800 Pure hypercholesterolemia, unspecified: Secondary | ICD-10-CM | POA: Diagnosis not present

## 2019-05-11 DIAGNOSIS — Z79899 Other long term (current) drug therapy: Secondary | ICD-10-CM | POA: Diagnosis not present

## 2019-05-11 DIAGNOSIS — J431 Panlobular emphysema: Secondary | ICD-10-CM | POA: Diagnosis not present

## 2019-05-11 DIAGNOSIS — I10 Essential (primary) hypertension: Secondary | ICD-10-CM | POA: Diagnosis not present

## 2019-06-15 DIAGNOSIS — M7582 Other shoulder lesions, left shoulder: Secondary | ICD-10-CM | POA: Diagnosis not present

## 2019-06-15 DIAGNOSIS — Z853 Personal history of malignant neoplasm of breast: Secondary | ICD-10-CM | POA: Diagnosis not present

## 2019-06-15 DIAGNOSIS — Z9012 Acquired absence of left breast and nipple: Secondary | ICD-10-CM | POA: Diagnosis not present

## 2019-06-15 DIAGNOSIS — Z87891 Personal history of nicotine dependence: Secondary | ICD-10-CM | POA: Diagnosis not present

## 2019-06-15 DIAGNOSIS — M25512 Pain in left shoulder: Secondary | ICD-10-CM | POA: Diagnosis not present

## 2019-07-14 DIAGNOSIS — M7582 Other shoulder lesions, left shoulder: Secondary | ICD-10-CM | POA: Diagnosis not present

## 2019-07-14 DIAGNOSIS — Z87891 Personal history of nicotine dependence: Secondary | ICD-10-CM | POA: Diagnosis not present

## 2019-08-16 DIAGNOSIS — M778 Other enthesopathies, not elsewhere classified: Secondary | ICD-10-CM | POA: Diagnosis not present

## 2019-08-16 DIAGNOSIS — M8949 Other hypertrophic osteoarthropathy, multiple sites: Secondary | ICD-10-CM | POA: Diagnosis not present

## 2019-09-01 DIAGNOSIS — R29898 Other symptoms and signs involving the musculoskeletal system: Secondary | ICD-10-CM | POA: Diagnosis not present

## 2019-09-01 DIAGNOSIS — M25512 Pain in left shoulder: Secondary | ICD-10-CM | POA: Diagnosis not present

## 2019-09-01 DIAGNOSIS — M25612 Stiffness of left shoulder, not elsewhere classified: Secondary | ICD-10-CM | POA: Diagnosis not present

## 2019-09-01 DIAGNOSIS — G8929 Other chronic pain: Secondary | ICD-10-CM | POA: Diagnosis not present

## 2019-09-06 DIAGNOSIS — M25512 Pain in left shoulder: Secondary | ICD-10-CM | POA: Diagnosis not present

## 2019-09-06 DIAGNOSIS — G8929 Other chronic pain: Secondary | ICD-10-CM | POA: Diagnosis not present

## 2019-09-08 DIAGNOSIS — G8929 Other chronic pain: Secondary | ICD-10-CM | POA: Diagnosis not present

## 2019-09-08 DIAGNOSIS — M25512 Pain in left shoulder: Secondary | ICD-10-CM | POA: Diagnosis not present

## 2019-09-11 DIAGNOSIS — M25512 Pain in left shoulder: Secondary | ICD-10-CM | POA: Diagnosis not present

## 2019-09-11 DIAGNOSIS — G8929 Other chronic pain: Secondary | ICD-10-CM | POA: Diagnosis not present

## 2019-09-19 DIAGNOSIS — M25512 Pain in left shoulder: Secondary | ICD-10-CM | POA: Diagnosis not present

## 2019-09-19 DIAGNOSIS — G8929 Other chronic pain: Secondary | ICD-10-CM | POA: Diagnosis not present

## 2019-10-11 ENCOUNTER — Other Ambulatory Visit: Payer: Self-pay | Admitting: Internal Medicine

## 2019-10-11 DIAGNOSIS — Z1231 Encounter for screening mammogram for malignant neoplasm of breast: Secondary | ICD-10-CM

## 2019-10-12 ENCOUNTER — Other Ambulatory Visit: Payer: Self-pay | Admitting: Internal Medicine

## 2019-10-12 DIAGNOSIS — M25512 Pain in left shoulder: Secondary | ICD-10-CM

## 2019-10-22 ENCOUNTER — Ambulatory Visit
Admission: RE | Admit: 2019-10-22 | Discharge: 2019-10-22 | Disposition: A | Discharge status: Home / Self Care | Payer: Medicare HMO | Source: Ambulatory Visit | Attending: Internal Medicine | Admitting: Internal Medicine

## 2019-10-22 ENCOUNTER — Other Ambulatory Visit: Payer: Self-pay

## 2019-10-22 DIAGNOSIS — M25512 Pain in left shoulder: Secondary | ICD-10-CM

## 2019-10-22 DIAGNOSIS — S46012A Strain of muscle(s) and tendon(s) of the rotator cuff of left shoulder, initial encounter: Secondary | ICD-10-CM | POA: Diagnosis not present

## 2019-10-22 DIAGNOSIS — M75102 Unspecified rotator cuff tear or rupture of left shoulder, not specified as traumatic: Secondary | ICD-10-CM | POA: Diagnosis not present

## 2019-11-03 DIAGNOSIS — M75112 Incomplete rotator cuff tear or rupture of left shoulder, not specified as traumatic: Secondary | ICD-10-CM | POA: Insufficient documentation

## 2019-11-03 DIAGNOSIS — M7522 Bicipital tendinitis, left shoulder: Secondary | ICD-10-CM | POA: Insufficient documentation

## 2019-11-03 DIAGNOSIS — M24112 Other articular cartilage disorders, left shoulder: Secondary | ICD-10-CM | POA: Diagnosis not present

## 2019-11-03 DIAGNOSIS — M778 Other enthesopathies, not elsewhere classified: Secondary | ICD-10-CM | POA: Diagnosis not present

## 2019-11-03 DIAGNOSIS — M7582 Other shoulder lesions, left shoulder: Secondary | ICD-10-CM | POA: Insufficient documentation

## 2019-11-15 DIAGNOSIS — E785 Hyperlipidemia, unspecified: Secondary | ICD-10-CM | POA: Diagnosis not present

## 2019-11-15 DIAGNOSIS — M25519 Pain in unspecified shoulder: Secondary | ICD-10-CM | POA: Diagnosis not present

## 2019-11-15 DIAGNOSIS — J438 Other emphysema: Secondary | ICD-10-CM | POA: Diagnosis not present

## 2019-11-15 DIAGNOSIS — I10 Essential (primary) hypertension: Secondary | ICD-10-CM | POA: Diagnosis not present

## 2019-11-15 DIAGNOSIS — Z0000 Encounter for general adult medical examination without abnormal findings: Secondary | ICD-10-CM | POA: Diagnosis not present

## 2019-11-15 DIAGNOSIS — J42 Unspecified chronic bronchitis: Secondary | ICD-10-CM | POA: Diagnosis not present

## 2019-11-15 DIAGNOSIS — R05 Cough: Secondary | ICD-10-CM | POA: Diagnosis not present

## 2019-11-15 DIAGNOSIS — Z79899 Other long term (current) drug therapy: Secondary | ICD-10-CM | POA: Diagnosis not present

## 2019-11-15 DIAGNOSIS — I700 Atherosclerosis of aorta: Secondary | ICD-10-CM | POA: Diagnosis not present

## 2019-11-16 DIAGNOSIS — M7062 Trochanteric bursitis, left hip: Secondary | ICD-10-CM | POA: Diagnosis not present

## 2019-11-16 DIAGNOSIS — M7061 Trochanteric bursitis, right hip: Secondary | ICD-10-CM | POA: Diagnosis not present

## 2019-11-16 DIAGNOSIS — M24112 Other articular cartilage disorders, left shoulder: Secondary | ICD-10-CM | POA: Diagnosis not present

## 2019-11-20 ENCOUNTER — Ambulatory Visit
Admission: RE | Admit: 2019-11-20 | Discharge: 2019-11-20 | Disposition: A | Discharge status: Home / Self Care | Payer: Medicare HMO | Source: Ambulatory Visit | Attending: Internal Medicine | Admitting: Internal Medicine

## 2019-11-20 DIAGNOSIS — Z1231 Encounter for screening mammogram for malignant neoplasm of breast: Secondary | ICD-10-CM | POA: Diagnosis not present

## 2020-01-05 DIAGNOSIS — C50112 Malignant neoplasm of central portion of left female breast: Secondary | ICD-10-CM | POA: Diagnosis not present

## 2020-01-05 DIAGNOSIS — Z4432 Encounter for fitting and adjustment of external left breast prosthesis: Secondary | ICD-10-CM | POA: Diagnosis not present

## 2020-05-14 DIAGNOSIS — J431 Panlobular emphysema: Secondary | ICD-10-CM | POA: Diagnosis not present

## 2020-05-14 DIAGNOSIS — E7800 Pure hypercholesterolemia, unspecified: Secondary | ICD-10-CM | POA: Diagnosis not present

## 2020-05-14 DIAGNOSIS — R05 Cough: Secondary | ICD-10-CM | POA: Diagnosis not present

## 2020-05-14 DIAGNOSIS — Z79899 Other long term (current) drug therapy: Secondary | ICD-10-CM | POA: Diagnosis not present

## 2020-05-14 DIAGNOSIS — I10 Essential (primary) hypertension: Secondary | ICD-10-CM | POA: Diagnosis not present

## 2020-05-20 DIAGNOSIS — M7522 Bicipital tendinitis, left shoulder: Secondary | ICD-10-CM | POA: Diagnosis not present

## 2020-05-20 DIAGNOSIS — M24112 Other articular cartilage disorders, left shoulder: Secondary | ICD-10-CM | POA: Diagnosis not present

## 2020-05-20 DIAGNOSIS — M7582 Other shoulder lesions, left shoulder: Secondary | ICD-10-CM | POA: Diagnosis not present

## 2020-05-20 DIAGNOSIS — M75112 Incomplete rotator cuff tear or rupture of left shoulder, not specified as traumatic: Secondary | ICD-10-CM | POA: Diagnosis not present

## 2020-05-22 ENCOUNTER — Ambulatory Visit (INDEPENDENT_AMBULATORY_CARE_PROVIDER_SITE_OTHER): Payer: Medicare HMO

## 2020-05-22 ENCOUNTER — Ambulatory Visit: Payer: Medicare HMO | Admitting: Podiatry

## 2020-05-22 ENCOUNTER — Other Ambulatory Visit: Payer: Self-pay

## 2020-05-22 ENCOUNTER — Encounter: Payer: Self-pay | Admitting: Podiatry

## 2020-05-22 DIAGNOSIS — M2041 Other hammer toe(s) (acquired), right foot: Secondary | ICD-10-CM

## 2020-05-22 DIAGNOSIS — G5782 Other specified mononeuropathies of left lower limb: Secondary | ICD-10-CM

## 2020-05-22 DIAGNOSIS — M1990 Unspecified osteoarthritis, unspecified site: Secondary | ICD-10-CM | POA: Insufficient documentation

## 2020-05-22 DIAGNOSIS — G5601 Carpal tunnel syndrome, right upper limb: Secondary | ICD-10-CM | POA: Insufficient documentation

## 2020-05-22 DIAGNOSIS — M778 Other enthesopathies, not elsewhere classified: Secondary | ICD-10-CM

## 2020-05-22 DIAGNOSIS — I10 Essential (primary) hypertension: Secondary | ICD-10-CM | POA: Insufficient documentation

## 2020-05-22 DIAGNOSIS — N814 Uterovaginal prolapse, unspecified: Secondary | ICD-10-CM | POA: Insufficient documentation

## 2020-05-22 DIAGNOSIS — R053 Chronic cough: Secondary | ICD-10-CM | POA: Insufficient documentation

## 2020-05-22 DIAGNOSIS — M2042 Other hammer toe(s) (acquired), left foot: Secondary | ICD-10-CM

## 2020-05-22 DIAGNOSIS — E7800 Pure hypercholesterolemia, unspecified: Secondary | ICD-10-CM | POA: Insufficient documentation

## 2020-05-22 DIAGNOSIS — G5762 Lesion of plantar nerve, left lower limb: Secondary | ICD-10-CM | POA: Diagnosis not present

## 2020-05-22 DIAGNOSIS — Z800 Family history of malignant neoplasm of digestive organs: Secondary | ICD-10-CM | POA: Insufficient documentation

## 2020-05-22 DIAGNOSIS — D649 Anemia, unspecified: Secondary | ICD-10-CM | POA: Insufficient documentation

## 2020-05-22 NOTE — Progress Notes (Signed)
Subjective:  Patient ID: Lindsey Mccall, female    DOB: 1951-03-01,  MRN: [id number] HPI Chief Complaint  Patient presents with  . Hammer Toe    Patient presents today for hammertoes bilat 2nd toes, right x years, no pain.  Left x 1 year and has become painful and swells.  She also says that area between toes left foot burn at times.  No treatement done    69 y.o. female presents with the above complaint.   ROS: Denies fever chills nausea vomiting muscle aches pains calf pain back pain chest pain shortness of breath.  Past Medical History:  Diagnosis Date  . Breast cancer ([place])    Left  . Cancer [facility]) 1993   Left breast  . COPD (chronic obstructive pulmonary disease) ([place])   . GERD (gastroesophageal reflux disease)   . Hypercholesteremia   . Hypertension   . Osteoarthritis    hands  . Wears dentures    full upper, implants lower   Past Surgical History:  Procedure Laterality Date  . CARPAL TUNNEL RELEASE Right   . CATARACT EXTRACTION W/PHACO Right 01/22/2016   Procedure: CATARACT EXTRACTION PHACO AND INTRAOCULAR LENS PLACEMENT ([place]) right eye;  Surgeon: Leandrew Koyanagi, MD;  Location: [place];  Service: Ophthalmology;  Laterality: Right;  . COLONOSCOPY    . MASTECTOMY Left 1993  . THUMB FUSION Bilateral   . TUBAL LIGATION      Current Outpatient Medications:  .  traZODone (DESYREL) 50 MG tablet, Take by mouth., Disp: , Rfl:  .  acetaminophen (TYLENOL) 650 MG CR tablet, Take by mouth., Disp: , Rfl:  .  amLODipine (NORVASC) 5 MG tablet, Take 5 mg by mouth daily., Disp: , Rfl:  .  chlorpheniramine-HYDROcodone (TUSSIONEX PENNKINETIC ER) 10-8 MG/5ML SUER, Take 5 mLs by mouth as needed for cough., Disp: , Rfl:  .  Cyanocobalamin (VITAMIN B-12 PO), Take by mouth., Disp: , Rfl:  .  esomeprazole (NEXIUM) 20 MG capsule, Take 20 mg by mouth daily at 12 noon., Disp: , Rfl:  .  losartan (COZAAR) 100 MG tablet, Take 100 mg by mouth daily., Disp: , Rfl:  .  lovastatin  (MEVACOR) 40 MG tablet, Take 40 mg by mouth at bedtime., Disp: , Rfl:  .  Melatonin 10 MG TABS, Take by mouth., Disp: , Rfl:   Allergies  Allergen Reactions  . Accuretic [Quinapril-Hydrochlorothiazide]     Potassium dropped  . Hydrocodone-Acetaminophen     Other reaction(s): Unknown  . Oxycodone-Acetaminophen     Other reaction(s): Unknown   Review of Systems Objective:  There were no vitals filed for this visit.  General: Well developed, nourished, in no acute distress, alert and oriented x3   Dermatological: Skin is warm, dry and supple bilateral. Nails x 10 are well maintained; remaining integument appears unremarkable at this time. There are no open sores, no preulcerative lesions, no rash or signs of infection present.  Vascular: Dorsalis Pedis artery and Posterior Tibial artery pedal pulses are 2/4 bilateral with immedate capillary fill time. Pedal hair growth present. No varicosities and no lower extremity edema present bilateral.   Neruologic: Grossly intact via light touch bilateral. Vibratory intact via tuning fork bilateral. Protective threshold with Semmes Wienstein monofilament intact to all pedal sites bilateral. Patellar and Achilles deep tendon reflexes 2+ bilateral. No Babinski or clonus noted bilateral.  Palpable Mulder's click to the third interdigital space of the left foot.  Musculoskeletal: No gross boney pedal deformities bilateral. No pain, crepitus, or limitation  noted with foot and ankle range of motion bilateral. Muscular strength 5/5 in all groups tested bilateral.  Severe bunion deformities with crossing over hammertoes.  She has pain on palpation of the second metatarsophalangeal joint of the left foot  Gait: Unassisted, Nonantalgic.    Radiographs:  Radiographs taken today demonstrate severe osteopenia with increase in the first intermetatarsal angle greater than normal values with a severe bunion deformity and severe dislocation of the second  metatarsophalangeal joint hammertoe deformity with a crossing over toe.  No acute fractures are noted.  Assessment & Plan:   Assessment: Capsulitis second metatarsophalangeal joint of the left foot with severe bunion deformities and cocked up hammertoe deformity second bilateral.  Neuroma third interdigital space left foot.  Plan: I injected 10 mg of Kenalog 5 mg Marcaine point of maximal tenderness around the second metatarsophalangeal joint of the left foot today.  I also injected 2 mg of dexamethasone local anesthetic to the third interdigital space of the left foot.  He tolerated these procedures well without complications.  I will follow-up with her in the near future for further discussion.     Jennalyn Cawley T. [place], [place]

## 2020-06-24 DIAGNOSIS — Z23 Encounter for immunization: Secondary | ICD-10-CM | POA: Diagnosis not present

## 2020-07-17 DIAGNOSIS — H2512 Age-related nuclear cataract, left eye: Secondary | ICD-10-CM | POA: Diagnosis not present

## 2020-08-13 DIAGNOSIS — I10 Essential (primary) hypertension: Secondary | ICD-10-CM | POA: Diagnosis not present

## 2020-08-13 DIAGNOSIS — H2512 Age-related nuclear cataract, left eye: Secondary | ICD-10-CM | POA: Diagnosis not present

## 2020-08-19 ENCOUNTER — Encounter: Payer: Self-pay | Admitting: Ophthalmology

## 2020-08-23 ENCOUNTER — Other Ambulatory Visit
Admission: RE | Admit: 2020-08-23 | Discharge: 2020-08-23 | Disposition: A | Discharge status: Home / Self Care | Payer: Medicare HMO | Source: Ambulatory Visit | Attending: Ophthalmology | Admitting: Ophthalmology

## 2020-08-23 ENCOUNTER — Other Ambulatory Visit: Payer: Self-pay

## 2020-08-23 DIAGNOSIS — Z20822 Contact with and (suspected) exposure to covid-19: Secondary | ICD-10-CM | POA: Insufficient documentation

## 2020-08-23 DIAGNOSIS — Z01812 Encounter for preprocedural laboratory examination: Secondary | ICD-10-CM | POA: Diagnosis not present

## 2020-08-24 LAB — SARS CORONAVIRUS 2 (TAT 6-24 HRS): SARS Coronavirus 2: NEGATIVE

## 2020-08-26 NOTE — Discharge Instructions (Signed)

## 2020-08-27 ENCOUNTER — Encounter: Payer: Self-pay | Admitting: Ophthalmology

## 2020-08-27 ENCOUNTER — Ambulatory Visit: Payer: Medicare HMO | Admitting: Anesthesiology

## 2020-08-27 ENCOUNTER — Ambulatory Visit
Admission: RE | Admit: 2020-08-27 | Discharge: 2020-08-27 | Disposition: A | Discharge status: Home / Self Care | Payer: Medicare HMO | Attending: Ophthalmology | Admitting: Ophthalmology

## 2020-08-27 ENCOUNTER — Other Ambulatory Visit: Payer: Self-pay

## 2020-08-27 ENCOUNTER — Encounter
Admission: RE | Disposition: A | Discharge status: Home / Self Care | Payer: Self-pay | Source: Home / Self Care | Attending: Ophthalmology

## 2020-08-27 DIAGNOSIS — H2512 Age-related nuclear cataract, left eye: Secondary | ICD-10-CM | POA: Insufficient documentation

## 2020-08-27 DIAGNOSIS — Z87891 Personal history of nicotine dependence: Secondary | ICD-10-CM | POA: Insufficient documentation

## 2020-08-27 DIAGNOSIS — Z888 Allergy status to other drugs, medicaments and biological substances status: Secondary | ICD-10-CM | POA: Insufficient documentation

## 2020-08-27 DIAGNOSIS — Z9012 Acquired absence of left breast and nipple: Secondary | ICD-10-CM | POA: Insufficient documentation

## 2020-08-27 DIAGNOSIS — Z79899 Other long term (current) drug therapy: Secondary | ICD-10-CM | POA: Diagnosis not present

## 2020-08-27 DIAGNOSIS — H25812 Combined forms of age-related cataract, left eye: Secondary | ICD-10-CM | POA: Diagnosis not present

## 2020-08-27 HISTORY — PX: CATARACT EXTRACTION W/PHACO: SHX586

## 2020-08-27 HISTORY — DX: Other complications of anesthesia, initial encounter: T88.59XA

## 2020-08-27 SURGERY — PHACOEMULSIFICATION, CATARACT, WITH IOL INSERTION
Anesthesia: Monitor Anesthesia Care | Site: Eye | Laterality: Left

## 2020-08-27 MED ORDER — ARMC OPHTHALMIC DILATING DROPS
1.0000 "application " | OPHTHALMIC | Status: DC | PRN
  Administered 2020-08-27 (×3): 1 via OPHTHALMIC

## 2020-08-27 MED ORDER — TETRACAINE HCL 0.5 % OP SOLN
1.0000 [drp] | OPHTHALMIC | Status: DC | PRN
  Administered 2020-08-27 (×3): 1 [drp] via OPHTHALMIC

## 2020-08-27 MED ORDER — EPINEPHRINE PF 1 MG/ML IJ SOLN
INTRAOCULAR | Status: DC | PRN
  Administered 2020-08-27: 59 mL via OPHTHALMIC

## 2020-08-27 MED ORDER — LIDOCAINE HCL (PF) 2 % IJ SOLN
INTRAOCULAR | Status: DC | PRN
  Administered 2020-08-27: 2 mL

## 2020-08-27 MED ORDER — BRIMONIDINE TARTRATE-TIMOLOL 0.2-0.5 % OP SOLN
OPHTHALMIC | Status: DC | PRN
  Administered 2020-08-27: 1 [drp] via OPHTHALMIC

## 2020-08-27 MED ORDER — FENTANYL CITRATE (PF) 100 MCG/2ML IJ SOLN
INTRAMUSCULAR | Status: DC | PRN
  Administered 2020-08-27: 50 ug via INTRAVENOUS

## 2020-08-27 MED ORDER — NA CHONDROIT SULF-NA HYALURON 40-17 MG/ML IO SOLN
INTRAOCULAR | Status: DC | PRN
  Administered 2020-08-27: 1 mL via INTRAOCULAR

## 2020-08-27 MED ORDER — MOXIFLOXACIN HCL 0.5 % OP SOLN
OPHTHALMIC | Status: DC | PRN
  Administered 2020-08-27: 0.2 mL via OPHTHALMIC

## 2020-08-27 MED ORDER — MIDAZOLAM HCL 2 MG/2ML IJ SOLN
INTRAMUSCULAR | Status: DC | PRN
  Administered 2020-08-27 (×2): 1 mg via INTRAVENOUS

## 2020-08-27 SURGICAL SUPPLY — 19 items
CANNULA ANT/CHMB 27G (MISCELLANEOUS) ×2 IMPLANT
CANNULA ANT/CHMB 27GA (MISCELLANEOUS) ×6 IMPLANT
GLOVE SURG LX 8.0 MICRO (GLOVE) ×2
GLOVE SURG LX STRL 8.0 MICRO (GLOVE) ×1 IMPLANT
GLOVE SURG TRIUMPH 8.0 PF LTX (GLOVE) ×3 IMPLANT
GOWN STRL REUS W/ TWL LRG LVL3 (GOWN DISPOSABLE) ×2 IMPLANT
GOWN STRL REUS W/TWL LRG LVL3 (GOWN DISPOSABLE) ×6
LENS IOL TECNIS EYHANCE 19.0 (Intraocular Lens) ×2 IMPLANT
MARKER SKIN DUAL TIP RULER LAB (MISCELLANEOUS) ×3 IMPLANT
NDL FILTER BLUNT 18X1 1/2 (NEEDLE) ×1 IMPLANT
NEEDLE FILTER BLUNT 18X 1/2SAF (NEEDLE) ×2
NEEDLE FILTER BLUNT 18X1 1/2 (NEEDLE) ×1 IMPLANT
PACK EYE AFTER SURG (MISCELLANEOUS) ×3 IMPLANT
PACK OPTHALMIC (MISCELLANEOUS) ×3 IMPLANT
PACK PORFILIO (MISCELLANEOUS) ×3 IMPLANT
SYR 3ML LL SCALE MARK (SYRINGE) ×3 IMPLANT
SYR TB 1ML LUER SLIP (SYRINGE) ×3 IMPLANT
WATER STERILE IRR 250ML POUR (IV SOLUTION) ×3 IMPLANT
WIPE NON LINTING 3.25X3.25 (MISCELLANEOUS) ×3 IMPLANT

## 2020-08-27 NOTE — Op Note (Signed)
PREOPERATIVE DIAGNOSIS:  Nuclear sclerotic cataract of the left eye.   POSTOPERATIVE DIAGNOSIS:  Nuclear sclerotic cataract of the left eye.   OPERATIVE PROCEDURE:@   SURGEON:  Birder Robson, MD.   ANESTHESIA:  Anesthesiologist: Veda Canning, MD CRNA: Mayme Genta, CRNA  1.      Managed anesthesia care. 2.     0.52ml of Shugarcaine was instilled following the paracentesis   COMPLICATIONS:  None.   TECHNIQUE:   Stop and chop   DESCRIPTION OF PROCEDURE:  The patient was examined and consented in the preoperative holding area where the aforementioned topical anesthesia was applied to the left eye and then brought back to the Operating Room where the left eye was prepped and draped in the usual sterile ophthalmic fashion and a lid speculum was placed. A paracentesis was created with the side port blade and the anterior chamber was filled with viscoelastic. A near clear corneal incision was performed with the steel keratome. A continuous curvilinear capsulorrhexis was performed with a cystotome followed by the capsulorrhexis forceps. Hydrodissection and hydrodelineation were carried out with BSS on a blunt cannula. The lens was removed in a stop and chop  technique and the remaining cortical material was removed with the irrigation-aspiration handpiece. The capsular bag was inflated with viscoelastic and the Technis [id number] lens was placed in the capsular bag without complication. The remaining viscoelastic was removed from the eye with the irrigation-aspiration handpiece. The wounds were hydrated. The anterior chamber was flushed with BSS and the eye was inflated to physiologic pressure. 0.66ml Vigamox was placed in the anterior chamber. The wounds were found to be water tight. The eye was dressed with Combigan. The patient was given protective glasses to wear throughout the day and a shield with which to sleep tonight. The patient was also given drops with which to begin a drop regimen today and  will follow-up with me in one day. Implant Name Type Inv. Item Serial No. Manufacturer Lot No. LRB No. Used Action  LENS IOL TECNIS EYHANCE 19.0 - [id number] Intraocular Lens LENS IOL TECNIS EYHANCE 19.0 [id number] JOHNSON   Left 1 Implanted    Procedure(s): CATARACT EXTRACTION PHACO AND INTRAOCULAR LENS PLACEMENT (IOC) LEFT 9.79 00:55.8  (Left)  Electronically signed: Birder Robson 08/27/2020 10:27 AM

## 2020-08-27 NOTE — Anesthesia Postprocedure Evaluation (Signed)
Anesthesia Post Note  Patient: Lindsey Mccall  Procedure(s) Performed: CATARACT EXTRACTION PHACO AND INTRAOCULAR LENS PLACEMENT (IOC) LEFT 9.79 00:55.8  (Left Eye)     Patient location during evaluation: PACU Anesthesia Type: MAC Level of consciousness: awake Pain management: pain level controlled Vital Signs Assessment: post-procedure vital signs reviewed and stable Respiratory status: respiratory function stable Cardiovascular status: stable Postop Assessment: no apparent nausea or vomiting Anesthetic complications: no   No complications documented.  Veda Canning

## 2020-08-27 NOTE — Transfer of Care (Signed)
Immediate Anesthesia Transfer of Care Note  Patient: Lindsey Mccall  Procedure(s) Performed: CATARACT EXTRACTION PHACO AND INTRAOCULAR LENS PLACEMENT (IOC) LEFT 9.79 00:55.8  (Left Eye)  Patient Location: PACU  Anesthesia Type: MAC  Level of Consciousness: awake, alert  and patient cooperative  Airway and Oxygen Therapy: Patient Spontanous Breathing and Patient connected to supplemental oxygen  Post-op Assessment: Post-op Vital signs reviewed, Patient's Cardiovascular Status Stable, Respiratory Function Stable, Patent Airway and No signs of Nausea or vomiting  Post-op Vital Signs: Reviewed and stable  Complications: No complications documented.

## 2020-08-27 NOTE — Anesthesia Procedure Notes (Signed)
Procedure Name: MAC Performed by: Gearold Wainer, CRNA Pre-anesthesia Checklist: Patient identified, Emergency Drugs available, Suction available, Timeout performed and Patient being monitored Patient Re-evaluated:Patient Re-evaluated prior to induction Oxygen Delivery Method: Nasal cannula Placement Confirmation: positive ETCO2       

## 2020-08-27 NOTE — Anesthesia Preprocedure Evaluation (Signed)
Anesthesia Evaluation  Patient identified by MRN, date of birth, ID band Patient awake    Reviewed: Allergy & Precautions, NPO status   Airway Mallampati: II  TM Distance: >3 FB     Dental   Pulmonary COPD, former smoker,    breath sounds clear to auscultation       Cardiovascular hypertension,  Rhythm:Regular Rate:Normal     Neuro/Psych    GI/Hepatic GERD  ,  Endo/Other    Renal/GU      Musculoskeletal  (+) Arthritis ,   Abdominal   Peds  Hematology  (+) anemia ,   Anesthesia Other Findings Hx breast cancer  Reproductive/Obstetrics                             Anesthesia Physical Anesthesia Plan  ASA: II  Anesthesia Plan: MAC   Post-op Pain Management:    Induction: Intravenous  PONV Risk Score and Plan: TIVA, Midazolam and Treatment may vary due to age or medical condition  Airway Management Planned: Natural Airway and Nasal Cannula  Additional Equipment:   Intra-op Plan:   Post-operative Plan:   Informed Consent: I have reviewed the patients History and Physical, chart, labs and discussed the procedure including the risks, benefits and alternatives for the proposed anesthesia with the patient or authorized representative who has indicated his/her understanding and acceptance.       Plan Discussed with: CRNA  Anesthesia Plan Comments:         Anesthesia Quick Evaluation

## 2020-08-27 NOTE — H&P (Signed)
[facility]   Primary Care Physician:  Idelle Crouch, MD Ophthalmologist: Dr. George Ina  Pre-Procedure History & Physical: HPI:  Lindsey Mccall is a 69 y.o. female here for cataract surgery.   Past Medical History:  Diagnosis Date  . Breast cancer ([place])    Left  . Cancer [facility]) 1993   Left breast  . Complication of anesthesia    "slow to wake up" after colonoscopy  . COPD (chronic obstructive pulmonary disease) ([place])   . GERD (gastroesophageal reflux disease)   . Hypercholesteremia   . Hypertension   . Osteoarthritis    hands  . Wears dentures    full upper, implants lower  . Wears dentures     Past Surgical History:  Procedure Laterality Date  . CARPAL TUNNEL RELEASE Right   . CATARACT EXTRACTION W/PHACO Right 01/22/2016   Procedure: CATARACT EXTRACTION PHACO AND INTRAOCULAR LENS PLACEMENT ([place]) right eye;  Surgeon: Leandrew Koyanagi, MD;  Location: [place];  Service: Ophthalmology;  Laterality: Right;  . COLONOSCOPY    . MASTECTOMY Left 1993  . THUMB FUSION Bilateral   . TUBAL LIGATION      Prior to Admission medications   Medication Sig Start Date End Date Taking? Authorizing Provider  acetaminophen (TYLENOL) 650 MG CR tablet Take by mouth.   Yes [provider]  amLODipine (NORVASC) 5 MG tablet Take 5 mg by mouth daily.   Yes [provider]  chlorpheniramine-HYDROcodone (TUSSIONEX PENNKINETIC ER) 10-8 MG/5ML SUER Take 5 mLs by mouth as needed for cough.   Yes [provider]  Cyanocobalamin (VITAMIN B-12 PO) Take by mouth.   Yes [provider]  esomeprazole (NEXIUM) 20 MG capsule Take 20 mg by mouth daily at 12 noon.   Yes [provider]  losartan (COZAAR) 100 MG tablet Take 100 mg by mouth daily.   Yes [provider]  lovastatin (MEVACOR) 40 MG tablet Take 40 mg by mouth at bedtime.   Yes [provider]  Melatonin 10 MG TABS Take by mouth.   Yes [provider]   traZODone (DESYREL) 50 MG tablet Take by mouth. Patient not taking: Reported on 08/19/2020 11/15/19 11/14/20  [provider]    Allergies as of 07/19/2020 - Review Complete 05/22/2020  Allergen Reaction Noted  . Accuretic [quinapril-hydrochlorothiazide]  01/17/2016  . Hydrocodone-acetaminophen  12/15/2013  . Oxycodone-acetaminophen  12/15/2013    Family History  Problem Relation Age of Onset  . Breast cancer Neg Hx     Social History   Socioeconomic History  . Marital status: Married    Spouse name: Not on file  . Number of children: Not on file  . Years of education: Not on file  . Highest education level: Not on file  Occupational History  . Not on file  Tobacco Use  . Smoking status: Former Smoker    Types: Cigarettes    Quit date: 09/22/1999    Years since quitting: 20.9  . Smokeless tobacco: Never Used  Substance and Sexual Activity  . Alcohol use: Yes    Alcohol/week: 3.0 standard drinks    Types: 3 Cans of beer per week  . Drug use: Not on file  . Sexual activity: Not on file  Other Topics Concern  . Not on file  Social History Narrative  . Not on file   Social Determinants of Health   Financial Resource Strain:   . Difficulty of Paying Living Expenses: Not on file  Food Insecurity:   .  Worried About Charity fundraiser in the Last Year: Not on file  . Ran Out of Food in the Last Year: Not on file  Transportation Needs:   . Lack of Transportation (Medical): Not on file  . Lack of Transportation (Non-Medical): Not on file  Physical Activity:   . Days of Exercise per Week: Not on file  . Minutes of Exercise per Session: Not on file  Stress:   . Feeling of Stress : Not on file  Social Connections:   . Frequency of Communication with Friends and Family: Not on file  . Frequency of Social Gatherings with Friends and Family: Not on file  . Attends Religious Services: Not on file  . Active Member of Clubs or Organizations: Not on file  . Attends  Archivist Meetings: Not on file  . Marital Status: Not on file  Intimate Partner Violence:   . Fear of Current or Ex-Partner: Not on file  . Emotionally Abused: Not on file  . Physically Abused: Not on file  . Sexually Abused: Not on file    Review of Systems: See HPI, otherwise negative ROS  Physical Exam: BP (!) 141/67   Pulse (!) 102   Temp 97.8 F (36.6 C) (Temporal)   Ht 5' (1.524 m)   Wt 49.9 kg   SpO2 100%   BMI 21.48 kg/m  General:   Alert,  pleasant and cooperative in NAD Head:  Normocephalic and atraumatic. Respiratory:  Normal work of breathing. Heart:  Regular rate and rhythm.  Impression/Plan: Lindsey Mccall is here for cataract surgery.  Risks, benefits, limitations, and alternatives regarding cataract surgery have been reviewed with the patient.  Questions have been answered.  All parties agreeable.   Birder Robson, MD  08/27/2020, 10:02 AM

## 2020-08-28 ENCOUNTER — Encounter: Payer: Self-pay | Admitting: Ophthalmology

## 2020-09-02 ENCOUNTER — Ambulatory Visit: Payer: Medicare HMO | Admitting: Dermatology

## 2020-09-02 ENCOUNTER — Other Ambulatory Visit: Payer: Self-pay

## 2020-09-02 DIAGNOSIS — L578 Other skin changes due to chronic exposure to nonionizing radiation: Secondary | ICD-10-CM | POA: Diagnosis not present

## 2020-09-02 DIAGNOSIS — L820 Inflamed seborrheic keratosis: Secondary | ICD-10-CM | POA: Diagnosis not present

## 2020-09-02 DIAGNOSIS — L821 Other seborrheic keratosis: Secondary | ICD-10-CM

## 2020-09-02 NOTE — Progress Notes (Signed)
   New Patient Visit  Subjective  Lindsey Mccall is a 69 y.o. female who presents for the following: Skin lesion (On the L leg - has been there for about 6 months and is growing larger. She would also like her arms checked.).  The following portions of the chart were reviewed this encounter and updated as appropriate:   Tobacco  Allergies  Meds  Problems  Med Hx  Surg Hx  Fam Hx     Review of Systems:  No other skin or systemic complaints except as noted in HPI or Assessment and Plan.  Objective  Well appearing patient in no apparent distress; mood and affect are within normal limits.  A focused examination was performed including the left leg and arms. Relevant physical exam findings are noted in the Assessment and Plan.  Objective  L pretibial x 1: Erythematous keratotic or waxy stuck-on papule or plaque.   Assessment & Plan  Inflamed seborrheic keratosis L pretibial x 1  Destruction of lesion - L pretibial x 1 Complexity: simple   Destruction method: cryotherapy   Informed consent: discussed and consent obtained   Timeout:  patient name, date of birth, surgical site, and procedure verified Lesion destroyed using liquid nitrogen: Yes   Region frozen until ice ball extended beyond lesion: Yes   Outcome: patient tolerated procedure well with no complications   Post-procedure details: wound care instructions given     Seborrheic Keratoses - Stuck-on, waxy, tan-brown papules and plaques  - Discussed benign etiology and prognosis. - Observe - Call for any changes  Actinic Damage - chronic, secondary to cumulative UV radiation exposure/sun exposure over time - diffuse scaly erythematous macules with underlying dyspigmentation - Recommend daily broad spectrum sunscreen SPF 30+ to sun-exposed areas, reapply every 2 hours as needed.  - Call for new or changing lesions.  Return if symptoms worsen or fail to improve.  Luther Redo, CMA, am acting as scribe for Sarina Ser, MD .  Documentation: I have reviewed the above documentation for accuracy and completeness, and I agree with the above.  Sarina Ser, MD

## 2020-09-05 ENCOUNTER — Encounter: Payer: Self-pay | Admitting: Dermatology

## 2020-10-03 DIAGNOSIS — Z961 Presence of intraocular lens: Secondary | ICD-10-CM | POA: Diagnosis not present

## 2020-10-28 ENCOUNTER — Other Ambulatory Visit: Payer: Self-pay | Admitting: Internal Medicine

## 2020-10-28 DIAGNOSIS — Z1231 Encounter for screening mammogram for malignant neoplasm of breast: Secondary | ICD-10-CM

## 2020-11-11 DIAGNOSIS — M7582 Other shoulder lesions, left shoulder: Secondary | ICD-10-CM | POA: Diagnosis not present

## 2020-11-11 DIAGNOSIS — M19042 Primary osteoarthritis, left hand: Secondary | ICD-10-CM | POA: Diagnosis not present

## 2020-11-11 DIAGNOSIS — M7522 Bicipital tendinitis, left shoulder: Secondary | ICD-10-CM | POA: Diagnosis not present

## 2020-11-11 DIAGNOSIS — M19041 Primary osteoarthritis, right hand: Secondary | ICD-10-CM | POA: Diagnosis not present

## 2020-11-11 DIAGNOSIS — M75112 Incomplete rotator cuff tear or rupture of left shoulder, not specified as traumatic: Secondary | ICD-10-CM | POA: Diagnosis not present

## 2020-11-11 DIAGNOSIS — M25541 Pain in joints of right hand: Secondary | ICD-10-CM | POA: Diagnosis not present

## 2020-11-19 DIAGNOSIS — E785 Hyperlipidemia, unspecified: Secondary | ICD-10-CM | POA: Diagnosis not present

## 2020-11-19 DIAGNOSIS — R053 Chronic cough: Secondary | ICD-10-CM | POA: Diagnosis not present

## 2020-11-19 DIAGNOSIS — I10 Essential (primary) hypertension: Secondary | ICD-10-CM | POA: Diagnosis not present

## 2020-11-19 DIAGNOSIS — Z1231 Encounter for screening mammogram for malignant neoplasm of breast: Secondary | ICD-10-CM | POA: Diagnosis not present

## 2020-11-19 DIAGNOSIS — R059 Cough, unspecified: Secondary | ICD-10-CM | POA: Diagnosis not present

## 2020-11-19 DIAGNOSIS — J449 Chronic obstructive pulmonary disease, unspecified: Secondary | ICD-10-CM | POA: Diagnosis not present

## 2020-11-19 DIAGNOSIS — Z79899 Other long term (current) drug therapy: Secondary | ICD-10-CM | POA: Diagnosis not present

## 2020-11-19 DIAGNOSIS — Z0000 Encounter for general adult medical examination without abnormal findings: Secondary | ICD-10-CM | POA: Diagnosis not present

## 2020-11-20 ENCOUNTER — Ambulatory Visit
Admission: RE | Admit: 2020-11-20 | Discharge: 2020-11-20 | Disposition: A | Discharge status: Home / Self Care | Payer: Medicare HMO | Source: Ambulatory Visit | Attending: Internal Medicine | Admitting: Internal Medicine

## 2020-11-20 ENCOUNTER — Other Ambulatory Visit: Payer: Self-pay

## 2020-11-20 DIAGNOSIS — Z1231 Encounter for screening mammogram for malignant neoplasm of breast: Secondary | ICD-10-CM | POA: Insufficient documentation

## 2021-04-16 DIAGNOSIS — H35032 Hypertensive retinopathy, left eye: Secondary | ICD-10-CM | POA: Diagnosis not present

## 2021-05-19 DIAGNOSIS — H35032 Hypertensive retinopathy, left eye: Secondary | ICD-10-CM | POA: Diagnosis not present

## 2021-05-27 DIAGNOSIS — H539 Unspecified visual disturbance: Secondary | ICD-10-CM | POA: Diagnosis not present

## 2021-05-27 DIAGNOSIS — I10 Essential (primary) hypertension: Secondary | ICD-10-CM | POA: Diagnosis not present

## 2021-05-27 DIAGNOSIS — Z79899 Other long term (current) drug therapy: Secondary | ICD-10-CM | POA: Diagnosis not present

## 2021-05-27 DIAGNOSIS — E7800 Pure hypercholesterolemia, unspecified: Secondary | ICD-10-CM | POA: Diagnosis not present

## 2021-05-27 DIAGNOSIS — R0989 Other specified symptoms and signs involving the circulatory and respiratory systems: Secondary | ICD-10-CM | POA: Diagnosis not present

## 2021-05-27 DIAGNOSIS — J431 Panlobular emphysema: Secondary | ICD-10-CM | POA: Diagnosis not present

## 2021-07-22 DIAGNOSIS — I6523 Occlusion and stenosis of bilateral carotid arteries: Secondary | ICD-10-CM | POA: Diagnosis not present

## 2021-07-22 DIAGNOSIS — R0989 Other specified symptoms and signs involving the circulatory and respiratory systems: Secondary | ICD-10-CM | POA: Diagnosis not present

## 2021-07-22 DIAGNOSIS — H539 Unspecified visual disturbance: Secondary | ICD-10-CM | POA: Diagnosis not present

## 2021-10-16 ENCOUNTER — Other Ambulatory Visit: Payer: Self-pay | Admitting: Internal Medicine

## 2021-10-16 DIAGNOSIS — Z1231 Encounter for screening mammogram for malignant neoplasm of breast: Secondary | ICD-10-CM

## 2021-11-21 ENCOUNTER — Ambulatory Visit
Admission: RE | Admit: 2021-11-21 | Discharge: 2021-11-21 | Disposition: A | Discharge status: Home / Self Care | Payer: Medicare HMO | Source: Ambulatory Visit | Attending: Internal Medicine | Admitting: Internal Medicine

## 2021-11-21 ENCOUNTER — Other Ambulatory Visit: Payer: Self-pay

## 2021-11-21 DIAGNOSIS — Z1231 Encounter for screening mammogram for malignant neoplasm of breast: Secondary | ICD-10-CM | POA: Insufficient documentation

## 2021-11-21 DIAGNOSIS — Z9012 Acquired absence of left breast and nipple: Secondary | ICD-10-CM | POA: Insufficient documentation

## 2021-11-25 DIAGNOSIS — Z79899 Other long term (current) drug therapy: Secondary | ICD-10-CM | POA: Diagnosis not present

## 2021-11-25 DIAGNOSIS — Z87891 Personal history of nicotine dependence: Secondary | ICD-10-CM | POA: Diagnosis not present

## 2021-11-25 DIAGNOSIS — J431 Panlobular emphysema: Secondary | ICD-10-CM | POA: Diagnosis not present

## 2021-11-25 DIAGNOSIS — R0602 Shortness of breath: Secondary | ICD-10-CM | POA: Diagnosis not present

## 2021-11-25 DIAGNOSIS — E785 Hyperlipidemia, unspecified: Secondary | ICD-10-CM | POA: Diagnosis not present

## 2021-11-25 DIAGNOSIS — R053 Chronic cough: Secondary | ICD-10-CM | POA: Diagnosis not present

## 2021-11-25 DIAGNOSIS — J449 Chronic obstructive pulmonary disease, unspecified: Secondary | ICD-10-CM | POA: Diagnosis not present

## 2021-11-25 DIAGNOSIS — Z0000 Encounter for general adult medical examination without abnormal findings: Secondary | ICD-10-CM | POA: Diagnosis not present

## 2021-11-25 DIAGNOSIS — I10 Essential (primary) hypertension: Secondary | ICD-10-CM | POA: Diagnosis not present

## 2022-05-20 DIAGNOSIS — H35032 Hypertensive retinopathy, left eye: Secondary | ICD-10-CM | POA: Diagnosis not present

## 2022-05-20 DIAGNOSIS — H43813 Vitreous degeneration, bilateral: Secondary | ICD-10-CM | POA: Diagnosis not present

## 2022-05-20 DIAGNOSIS — Z961 Presence of intraocular lens: Secondary | ICD-10-CM | POA: Diagnosis not present

## 2022-05-28 DIAGNOSIS — E7800 Pure hypercholesterolemia, unspecified: Secondary | ICD-10-CM | POA: Diagnosis not present

## 2022-05-28 DIAGNOSIS — M4312 Spondylolisthesis, cervical region: Secondary | ICD-10-CM | POA: Diagnosis not present

## 2022-05-28 DIAGNOSIS — M47812 Spondylosis without myelopathy or radiculopathy, cervical region: Secondary | ICD-10-CM | POA: Diagnosis not present

## 2022-05-28 DIAGNOSIS — Z79899 Other long term (current) drug therapy: Secondary | ICD-10-CM | POA: Diagnosis not present

## 2022-05-28 DIAGNOSIS — I10 Essential (primary) hypertension: Secondary | ICD-10-CM | POA: Diagnosis not present

## 2022-05-28 DIAGNOSIS — J431 Panlobular emphysema: Secondary | ICD-10-CM | POA: Diagnosis not present

## 2022-05-28 DIAGNOSIS — M542 Cervicalgia: Secondary | ICD-10-CM | POA: Diagnosis not present

## 2022-10-21 ENCOUNTER — Encounter: Payer: Self-pay | Admitting: Internal Medicine

## 2022-10-21 ENCOUNTER — Other Ambulatory Visit: Payer: Self-pay | Admitting: Internal Medicine

## 2022-10-21 DIAGNOSIS — Z1231 Encounter for screening mammogram for malignant neoplasm of breast: Secondary | ICD-10-CM

## 2022-11-23 ENCOUNTER — Ambulatory Visit
Admission: RE | Admit: 2022-11-23 | Discharge: 2022-11-23 | Disposition: A | Discharge status: Home / Self Care | Payer: Medicare HMO | Source: Ambulatory Visit | Attending: Internal Medicine | Admitting: Internal Medicine

## 2022-11-23 DIAGNOSIS — Z1231 Encounter for screening mammogram for malignant neoplasm of breast: Secondary | ICD-10-CM | POA: Diagnosis not present

## 2022-12-01 DIAGNOSIS — Z1211 Encounter for screening for malignant neoplasm of colon: Secondary | ICD-10-CM | POA: Diagnosis not present

## 2022-12-01 DIAGNOSIS — I10 Essential (primary) hypertension: Secondary | ICD-10-CM | POA: Diagnosis not present

## 2022-12-01 DIAGNOSIS — Z87891 Personal history of nicotine dependence: Secondary | ICD-10-CM | POA: Diagnosis not present

## 2022-12-01 DIAGNOSIS — E785 Hyperlipidemia, unspecified: Secondary | ICD-10-CM | POA: Diagnosis not present

## 2022-12-01 DIAGNOSIS — J449 Chronic obstructive pulmonary disease, unspecified: Secondary | ICD-10-CM | POA: Diagnosis not present

## 2022-12-01 DIAGNOSIS — Z0000 Encounter for general adult medical examination without abnormal findings: Secondary | ICD-10-CM | POA: Diagnosis not present

## 2022-12-01 DIAGNOSIS — Z79899 Other long term (current) drug therapy: Secondary | ICD-10-CM | POA: Diagnosis not present

## 2022-12-01 DIAGNOSIS — J439 Emphysema, unspecified: Secondary | ICD-10-CM | POA: Diagnosis not present

## 2023-01-04 DIAGNOSIS — Z79899 Other long term (current) drug therapy: Secondary | ICD-10-CM | POA: Diagnosis not present

## 2023-03-08 DIAGNOSIS — Z79899 Other long term (current) drug therapy: Secondary | ICD-10-CM | POA: Diagnosis not present

## 2023-03-19 DIAGNOSIS — R6881 Early satiety: Secondary | ICD-10-CM | POA: Diagnosis not present

## 2023-03-19 DIAGNOSIS — K591 Functional diarrhea: Secondary | ICD-10-CM | POA: Diagnosis not present

## 2023-03-19 DIAGNOSIS — K219 Gastro-esophageal reflux disease without esophagitis: Secondary | ICD-10-CM | POA: Diagnosis not present

## 2023-03-19 DIAGNOSIS — Z1211 Encounter for screening for malignant neoplasm of colon: Secondary | ICD-10-CM | POA: Diagnosis not present

## 2023-04-08 DIAGNOSIS — Z79899 Other long term (current) drug therapy: Secondary | ICD-10-CM | POA: Diagnosis not present

## 2023-06-03 DIAGNOSIS — Z79899 Other long term (current) drug therapy: Secondary | ICD-10-CM | POA: Diagnosis not present

## 2023-06-03 DIAGNOSIS — E7800 Pure hypercholesterolemia, unspecified: Secondary | ICD-10-CM | POA: Diagnosis not present

## 2023-06-03 DIAGNOSIS — J4489 Other specified chronic obstructive pulmonary disease: Secondary | ICD-10-CM | POA: Diagnosis not present

## 2023-06-03 DIAGNOSIS — D649 Anemia, unspecified: Secondary | ICD-10-CM | POA: Diagnosis not present

## 2023-06-03 DIAGNOSIS — I10 Essential (primary) hypertension: Secondary | ICD-10-CM | POA: Diagnosis not present

## 2023-06-03 DIAGNOSIS — Z87891 Personal history of nicotine dependence: Secondary | ICD-10-CM | POA: Diagnosis not present

## 2023-06-07 DIAGNOSIS — H43813 Vitreous degeneration, bilateral: Secondary | ICD-10-CM | POA: Diagnosis not present

## 2023-06-07 DIAGNOSIS — H35363 Drusen (degenerative) of macula, bilateral: Secondary | ICD-10-CM | POA: Diagnosis not present

## 2023-06-07 DIAGNOSIS — H35032 Hypertensive retinopathy, left eye: Secondary | ICD-10-CM | POA: Diagnosis not present

## 2023-06-15 DIAGNOSIS — R55 Syncope and collapse: Secondary | ICD-10-CM | POA: Diagnosis not present

## 2023-06-16 ENCOUNTER — Other Ambulatory Visit: Payer: Self-pay | Admitting: Internal Medicine

## 2023-06-16 DIAGNOSIS — R55 Syncope and collapse: Secondary | ICD-10-CM

## 2023-06-18 ENCOUNTER — Ambulatory Visit
Admission: RE | Admit: 2023-06-18 | Discharge: 2023-06-18 | Disposition: A | Discharge status: Home / Self Care | Payer: Medicare HMO | Source: Ambulatory Visit | Attending: Internal Medicine | Admitting: Internal Medicine

## 2023-06-18 DIAGNOSIS — R55 Syncope and collapse: Secondary | ICD-10-CM

## 2023-06-23 ENCOUNTER — Ambulatory Visit: Payer: Medicare HMO

## 2023-06-23 DIAGNOSIS — K5730 Diverticulosis of large intestine without perforation or abscess without bleeding: Secondary | ICD-10-CM | POA: Diagnosis not present

## 2023-06-23 DIAGNOSIS — K6389 Other specified diseases of intestine: Secondary | ICD-10-CM | POA: Diagnosis not present

## 2023-06-23 DIAGNOSIS — K449 Diaphragmatic hernia without obstruction or gangrene: Secondary | ICD-10-CM | POA: Diagnosis not present

## 2023-06-23 DIAGNOSIS — K209 Esophagitis, unspecified without bleeding: Secondary | ICD-10-CM | POA: Diagnosis not present

## 2023-06-23 DIAGNOSIS — K2960 Other gastritis without bleeding: Secondary | ICD-10-CM | POA: Diagnosis not present

## 2023-06-23 DIAGNOSIS — D126 Benign neoplasm of colon, unspecified: Secondary | ICD-10-CM | POA: Diagnosis not present

## 2023-06-23 DIAGNOSIS — K6289 Other specified diseases of anus and rectum: Secondary | ICD-10-CM | POA: Diagnosis not present

## 2023-06-23 DIAGNOSIS — K317 Polyp of stomach and duodenum: Secondary | ICD-10-CM | POA: Diagnosis not present

## 2023-06-23 DIAGNOSIS — K2289 Other specified disease of esophagus: Secondary | ICD-10-CM | POA: Diagnosis not present

## 2023-06-23 DIAGNOSIS — D123 Benign neoplasm of transverse colon: Secondary | ICD-10-CM | POA: Diagnosis not present

## 2023-06-23 DIAGNOSIS — D125 Benign neoplasm of sigmoid colon: Secondary | ICD-10-CM | POA: Diagnosis not present

## 2023-06-23 DIAGNOSIS — D124 Benign neoplasm of descending colon: Secondary | ICD-10-CM | POA: Diagnosis not present

## 2023-06-23 DIAGNOSIS — D122 Benign neoplasm of ascending colon: Secondary | ICD-10-CM | POA: Diagnosis not present

## 2023-06-23 DIAGNOSIS — Z1211 Encounter for screening for malignant neoplasm of colon: Secondary | ICD-10-CM | POA: Diagnosis not present

## 2023-06-23 DIAGNOSIS — D120 Benign neoplasm of cecum: Secondary | ICD-10-CM | POA: Diagnosis not present

## 2023-06-23 DIAGNOSIS — K635 Polyp of colon: Secondary | ICD-10-CM | POA: Diagnosis not present

## 2023-06-23 DIAGNOSIS — K208 Other esophagitis without bleeding: Secondary | ICD-10-CM | POA: Diagnosis not present

## 2023-06-29 DIAGNOSIS — Z1211 Encounter for screening for malignant neoplasm of colon: Secondary | ICD-10-CM | POA: Diagnosis not present

## 2023-07-15 DIAGNOSIS — I6523 Occlusion and stenosis of bilateral carotid arteries: Secondary | ICD-10-CM | POA: Diagnosis not present

## 2023-07-15 DIAGNOSIS — Z79899 Other long term (current) drug therapy: Secondary | ICD-10-CM | POA: Diagnosis not present

## 2023-07-15 DIAGNOSIS — R55 Syncope and collapse: Secondary | ICD-10-CM | POA: Diagnosis not present

## 2023-07-29 DIAGNOSIS — Z79899 Other long term (current) drug therapy: Secondary | ICD-10-CM | POA: Diagnosis not present

## 2023-07-29 DIAGNOSIS — E7800 Pure hypercholesterolemia, unspecified: Secondary | ICD-10-CM | POA: Diagnosis not present

## 2023-07-29 DIAGNOSIS — I10 Essential (primary) hypertension: Secondary | ICD-10-CM | POA: Diagnosis not present

## 2023-07-29 DIAGNOSIS — J431 Panlobular emphysema: Secondary | ICD-10-CM | POA: Diagnosis not present

## 2023-08-03 DIAGNOSIS — D123 Benign neoplasm of transverse colon: Secondary | ICD-10-CM | POA: Diagnosis not present

## 2023-10-29 ENCOUNTER — Other Ambulatory Visit: Payer: Self-pay | Admitting: Internal Medicine

## 2023-10-29 DIAGNOSIS — Z1231 Encounter for screening mammogram for malignant neoplasm of breast: Secondary | ICD-10-CM | POA: Diagnosis not present

## 2023-10-29 DIAGNOSIS — E7800 Pure hypercholesterolemia, unspecified: Secondary | ICD-10-CM | POA: Diagnosis not present

## 2023-10-29 DIAGNOSIS — Z79899 Other long term (current) drug therapy: Secondary | ICD-10-CM | POA: Diagnosis not present

## 2023-10-29 DIAGNOSIS — D649 Anemia, unspecified: Secondary | ICD-10-CM | POA: Diagnosis not present

## 2023-10-29 DIAGNOSIS — R053 Chronic cough: Secondary | ICD-10-CM | POA: Diagnosis not present

## 2023-10-29 DIAGNOSIS — I10 Essential (primary) hypertension: Secondary | ICD-10-CM | POA: Diagnosis not present

## 2023-11-04 DIAGNOSIS — D120 Benign neoplasm of cecum: Secondary | ICD-10-CM | POA: Diagnosis not present

## 2023-11-04 DIAGNOSIS — J449 Chronic obstructive pulmonary disease, unspecified: Secondary | ICD-10-CM | POA: Diagnosis not present

## 2023-11-04 DIAGNOSIS — Z87891 Personal history of nicotine dependence: Secondary | ICD-10-CM | POA: Diagnosis not present

## 2023-11-04 DIAGNOSIS — K5730 Diverticulosis of large intestine without perforation or abscess without bleeding: Secondary | ICD-10-CM | POA: Diagnosis not present

## 2023-11-04 DIAGNOSIS — K635 Polyp of colon: Secondary | ICD-10-CM | POA: Diagnosis not present

## 2023-11-04 DIAGNOSIS — Z9012 Acquired absence of left breast and nipple: Secondary | ICD-10-CM | POA: Diagnosis not present

## 2023-11-04 DIAGNOSIS — Z79899 Other long term (current) drug therapy: Secondary | ICD-10-CM | POA: Diagnosis not present

## 2023-11-04 DIAGNOSIS — Z860101 Personal history of adenomatous and serrated colon polyps: Secondary | ICD-10-CM | POA: Diagnosis not present

## 2023-11-04 DIAGNOSIS — Z853 Personal history of malignant neoplasm of breast: Secondary | ICD-10-CM | POA: Diagnosis not present

## 2023-11-04 DIAGNOSIS — D122 Benign neoplasm of ascending colon: Secondary | ICD-10-CM | POA: Diagnosis not present

## 2023-11-04 DIAGNOSIS — I10 Essential (primary) hypertension: Secondary | ICD-10-CM | POA: Diagnosis not present

## 2023-12-01 ENCOUNTER — Ambulatory Visit
Admission: RE | Admit: 2023-12-01 | Discharge: 2023-12-01 | Disposition: A | Discharge status: Home / Self Care | Source: Ambulatory Visit | Attending: Internal Medicine | Admitting: Internal Medicine

## 2023-12-01 DIAGNOSIS — Z1231 Encounter for screening mammogram for malignant neoplasm of breast: Secondary | ICD-10-CM | POA: Diagnosis not present

## 2024-02-18 DIAGNOSIS — Z79899 Other long term (current) drug therapy: Secondary | ICD-10-CM | POA: Diagnosis not present

## 2024-02-18 DIAGNOSIS — J431 Panlobular emphysema: Secondary | ICD-10-CM | POA: Diagnosis not present

## 2024-02-18 DIAGNOSIS — Z1331 Encounter for screening for depression: Secondary | ICD-10-CM | POA: Diagnosis not present

## 2024-02-18 DIAGNOSIS — R053 Chronic cough: Secondary | ICD-10-CM | POA: Diagnosis not present

## 2024-02-18 DIAGNOSIS — E785 Hyperlipidemia, unspecified: Secondary | ICD-10-CM | POA: Diagnosis not present

## 2024-02-18 DIAGNOSIS — I10 Essential (primary) hypertension: Secondary | ICD-10-CM | POA: Diagnosis not present

## 2024-02-18 DIAGNOSIS — Z0000 Encounter for general adult medical examination without abnormal findings: Secondary | ICD-10-CM | POA: Diagnosis not present

## 2024-02-21 DIAGNOSIS — R232 Flushing: Secondary | ICD-10-CM | POA: Diagnosis not present

## 2024-04-13 DIAGNOSIS — Z860101 Personal history of adenomatous and serrated colon polyps: Secondary | ICD-10-CM | POA: Diagnosis not present

## 2024-04-13 DIAGNOSIS — K591 Functional diarrhea: Secondary | ICD-10-CM | POA: Diagnosis not present

## 2024-04-13 DIAGNOSIS — K219 Gastro-esophageal reflux disease without esophagitis: Secondary | ICD-10-CM | POA: Diagnosis not present

## 2024-05-10 ENCOUNTER — Emergency Department (HOSPITAL_BASED_OUTPATIENT_CLINIC_OR_DEPARTMENT_OTHER)

## 2024-05-10 ENCOUNTER — Other Ambulatory Visit: Payer: Self-pay

## 2024-05-10 ENCOUNTER — Encounter (HOSPITAL_BASED_OUTPATIENT_CLINIC_OR_DEPARTMENT_OTHER): Payer: Self-pay

## 2024-05-10 ENCOUNTER — Emergency Department (HOSPITAL_BASED_OUTPATIENT_CLINIC_OR_DEPARTMENT_OTHER)
Admission: EM | Admit: 2024-05-10 | Discharge: 2024-05-10 | Disposition: A | Discharge status: Home / Self Care | Attending: Emergency Medicine | Admitting: Emergency Medicine

## 2024-05-10 DIAGNOSIS — I10 Essential (primary) hypertension: Secondary | ICD-10-CM | POA: Diagnosis not present

## 2024-05-10 DIAGNOSIS — J449 Chronic obstructive pulmonary disease, unspecified: Secondary | ICD-10-CM | POA: Diagnosis not present

## 2024-05-10 DIAGNOSIS — Z79899 Other long term (current) drug therapy: Secondary | ICD-10-CM | POA: Insufficient documentation

## 2024-05-10 DIAGNOSIS — Z853 Personal history of malignant neoplasm of breast: Secondary | ICD-10-CM | POA: Diagnosis not present

## 2024-05-10 DIAGNOSIS — Y92520 Airport as the place of occurrence of the external cause: Secondary | ICD-10-CM | POA: Insufficient documentation

## 2024-05-10 DIAGNOSIS — S82112A Displaced fracture of left tibial spine, initial encounter for closed fracture: Secondary | ICD-10-CM | POA: Diagnosis not present

## 2024-05-10 DIAGNOSIS — M25562 Pain in left knee: Secondary | ICD-10-CM | POA: Diagnosis present

## 2024-05-10 DIAGNOSIS — S82142A Displaced bicondylar fracture of left tibia, initial encounter for closed fracture: Secondary | ICD-10-CM | POA: Insufficient documentation

## 2024-05-10 DIAGNOSIS — S8992XA Unspecified injury of left lower leg, initial encounter: Secondary | ICD-10-CM | POA: Diagnosis not present

## 2024-05-10 DIAGNOSIS — M2580 Other specified joint disorders, unspecified joint: Secondary | ICD-10-CM | POA: Diagnosis not present

## 2024-05-10 DIAGNOSIS — W19XXXA Unspecified fall, initial encounter: Secondary | ICD-10-CM | POA: Diagnosis not present

## 2024-05-10 DIAGNOSIS — S80919A Unspecified superficial injury of unspecified knee, initial encounter: Secondary | ICD-10-CM | POA: Diagnosis not present

## 2024-05-10 DIAGNOSIS — M25862 Other specified joint disorders, left knee: Secondary | ICD-10-CM | POA: Diagnosis not present

## 2024-05-10 MED ORDER — HYDROCODONE-ACETAMINOPHEN 5-325 MG PO TABS: 1.0000 | ORAL_TABLET | ORAL | 0 refills | Status: DC | PRN

## 2024-05-10 NOTE — Discharge Instructions (Signed)
 Talk to your doctor about having close follow-up with an orthopedist.  Keep your leg elevated is much as possible.  Ice the area.  Do not put any weight on that leg until cleared by orthopedist.  Return to the emergency room if you have any worsening symptoms.

## 2024-05-10 NOTE — ED Notes (Signed)
 RN provided AVS using Teachback Method. Patient verbalizes understanding of Discharge Instructions. Opportunity for Questioning and Answers were provided by RN. Patient Discharged from ED with family to home.

## 2024-05-10 NOTE — ED Triage Notes (Signed)
 [place] and fell on left side injuring mainly her left knee.  + swelling Denies hitting her head or LOC -blood thinners

## 2024-05-10 NOTE — ED Provider Notes (Signed)
 [facility] Provider Note   CSN: [id number] Arrival date & time: 05/10/24  1859     Patient presents with: Knee Injury   Lindsey Mccall is a 73 y.o. female.   Patient is a 73 year old female with a history of prior breast cancer, hypertension, hyperlipidemia, COPD.  She had a mechanical fall at the airport this evening.  She was trying to get her luggage off the baggage rack and lost her balance, falling down onto her left knee.  She had immediate pain and swelling in the left knee.  She denies any other injuries.  No head injury.  No neck or back pain.  She is not on anticoagulants.       Prior to Admission medications   Medication Sig Start Date End Date Taking? Authorizing Provider  HYDROcodone -acetaminophen  (NORCO/VICODIN) 5-325 MG tablet Take 1-2 tablets by mouth every 4 (four) hours as needed. 05/10/24  Yes Lenor Hollering, MD  acetaminophen  (TYLENOL ) 650 MG CR tablet Take by mouth.    [provider]  amLODipine (NORVASC) 5 MG tablet Take 5 mg by mouth daily.    [provider]  chlorpheniramine-HYDROcodone  (TUSSIONEX PENNKINETIC ER) 10-8 MG/5ML SUER Take 5 mLs by mouth as needed for cough.    [provider]  Cyanocobalamin (VITAMIN B-12 PO) Take by mouth.    [provider]  esomeprazole (NEXIUM) 20 MG capsule Take 20 mg by mouth daily at 12 noon.    [provider]  losartan (COZAAR) 100 MG tablet Take 100 mg by mouth daily.    [provider]  lovastatin (MEVACOR) 40 MG tablet Take 40 mg by mouth at bedtime.    [provider]  Melatonin 10 MG TABS Take by mouth.    [provider]  traZODone (DESYREL) 50 MG tablet Take by mouth. Patient not taking: Reported on 08/19/2020 11/15/19 11/14/20  [provider]    Allergies: Accuretic [quinapril-hydrochlorothiazide]    Review of Systems  Constitutional:  Negative for fever.  Gastrointestinal:  Negative  for nausea and vomiting.  Musculoskeletal:  Positive for arthralgias and joint swelling. Negative for back pain and neck pain.  Skin:  Negative for wound.  Neurological:  Negative for weakness, numbness and headaches.    Updated Vital Signs BP (!) 178/82   Pulse 82   Temp 97.8 F (36.6 C) (Oral)   Resp 16   Ht 5' (1.524 m)   Wt 44.5 kg   SpO2 98%   BMI 19.14 kg/m   Physical Exam Constitutional:      Appearance: She is well-developed.  HENT:     Head: Normocephalic and atraumatic.  Cardiovascular:     Rate and Rhythm: Normal rate.  Pulmonary:     Effort: Pulmonary effort is normal.  Musculoskeletal:        General: Tenderness present.     Cervical back: Normal range of motion and neck supple.     Comments: Positive diffuse swelling to the left knee.  There is tenderness to the proximal tibia area.  She is able to do a straight leg raise.  No gross instability.  No pain to the hip or ankle area.  Pedal pulses are intact.  No wounds.  Compartments are soft.  No significant swelling to the lower leg.  Skin:    General: Skin is warm and dry.  Neurological:     Mental Status: She is alert and oriented to person, place, and time.     (  all labs ordered are listed, but only abnormal results are displayed) Labs Reviewed - No data to display  EKG: None  Radiology: CT Knee Left Wo Contrast Result Date: 05/10/2024 CLINICAL DATA:  Tibial plateau fracture EXAM: CT OF THE LEFT KNEE WITHOUT CONTRAST TECHNIQUE: Multidetector CT imaging of the left knee was performed according to the standard protocol. Multiplanar CT image reconstructions were also generated. RADIATION DOSE REDUCTION: This exam was performed according to the departmental dose-optimization program which includes automated exposure control, adjustment of the mA and/or kV according to patient size and/or use of iterative reconstruction technique. COMPARISON:  Knee x-ray 05/10/2024 FINDINGS: Bones/Joint/Cartilage Large knee  effusion with hematocrit level consistent with hemarthrosis. No dislocation. Acute mildly comminuted intra-articular fracture involving the lateral tibial plateau with approximately 5.6 mm of depression. Fracture extends to the lateral tibial spine. Suspect faint vertically oriented fracture lucency/possible cleavage component extending to the metaphysis with cortical breach, series 11, image 26, and sagittal series 13, image 30. No medial plateau fracture. Joint space calcifications consistent with chondrocalcinosis. Mild medial tibiofemoral joint space narrowing. Minimal superior patellar spurring. Ligaments Suboptimally assessed by CT. Muscles and Tendons No intramuscular fluid collections.  No significant atrophy. Soft tissues Vascular calcifications. Edema within the subcutaneous soft tissues. IMPRESSION: 1. Acute mildly comminuted intra-articular fracture involving the lateral tibial plateau with approximately 5.6 mm of depression. Associated large lipohemarthrosis. 2. Chondrocalcinosis. Electronically Signed   By: Luke Bun M.D.   On: 05/10/2024 21:54   DG Knee Complete 4 Views Left Result Date: 05/10/2024 CLINICAL DATA:  Trying to get luggage off conveyor belt. Fell and injured left knee. Cannot straighten out left leg EXAM: LEFT KNEE - COMPLETE 4+ VIEW COMPARISON:  None Available. FINDINGS: Moderate lipohemarthrosis. Acute depressed fracture of the lateral tibial plateau. Fracture lines extend into the intercondylar tubercles. No definite extension into the medial tibial plateau or tibial diaphysis. IMPRESSION: Acute depressed fracture of the lateral tibial plateau. Moderate lipohemarthrosis Electronically Signed   By: Norman Gatlin M.D.   On: 05/10/2024 20:33     Procedures   Medications Ordered in the ED - No data to display                                  Medical Decision Making Amount and/or Complexity of Data Reviewed Radiology: ordered.  Risk Prescription drug  management.   Patient presents with pain in her left knee.  Differential diagnosis includes hemarthrosis, ligamentous injury, fracture, dislocation, infection  History was obtained from the patient and the patient's family member at bedside.  X-rays were obtained which show evidence of a tibial plateau fracture.  Minimal displacement.  CT scan was performed for orthopedic use in the future.  Overall she is well-appearing with no signs of compartment syndrome or instability of the joint.  She does not require any pain medication.  She was placed in a knee immobilizer.  She was advised to be strict nonweightbearing.  She has a walker at home to use.  She will follow-up with an orthopedist.  She is from [facility] and was going to check with her primary care doctor tomorrow regarding orthopedic follow-up but I did give her a number of [facility] in [place] as well.  Advised that she will need close follow-up with the orthopedist to determine definitive treatment.  She was advised to watch for any signs of compartment syndrome and return if she has any worsening symptoms or suggestions of  compartment syndrome.  She said that she thought she would do okay with Tylenol  or ibuprofen however did request a small prescription for hydrocodone .  Initially this was listed as an allergy however she takes daily hydrocodone  suspension for cough.  Therefore I removed hydrocodone  as an allergy and she was given a prescription for just a few tablets of this.  She only takes the hydrocodone  suspension once a day.     Final diagnoses:  Tibial plateau fracture, left, closed, initial encounter    ED Discharge Orders          Ordered    HYDROcodone -acetaminophen  (NORCO/VICODIN) 5-325 MG tablet  Every 4 hours PRN        05/10/24 2153               Lenor Hollering, MD 05/10/24 2226

## 2024-05-11 DIAGNOSIS — S82132A Displaced fracture of medial condyle of left tibia, initial encounter for closed fracture: Secondary | ICD-10-CM | POA: Diagnosis not present

## 2024-05-16 ENCOUNTER — Other Ambulatory Visit: Payer: Self-pay | Admitting: Surgery

## 2024-05-16 DIAGNOSIS — S82142A Displaced bicondylar fracture of left tibia, initial encounter for closed fracture: Secondary | ICD-10-CM | POA: Diagnosis not present

## 2024-05-16 NOTE — Progress Notes (Signed)
 Chief Complaint: Chief Complaint  Patient presents with  . Knee Pain    Left tibial plateau fracture     History of Present Illness Lindsey Mccall is a 73 year old female who presents with a left knee injury following a fall.  [place] 10, 20[place]. The knee struck the hard floor, causing significant pain and swelling. Imaging in the emergency room revealed a depressed fracture along the lateral tibial plateau, a large knee effusion, hemoarthrosis, and a mildly comminuted intraarticular fracture with 5.6 mm of depression.  She wears a brace and applies ice to the knee two to three times daily but continues to experience persistent swelling. Her mobility is significantly reduced from her previous activity level of walking three to four times a week.  For pain, she takes Advil and hydrocodone , using half a tablet at a time, totaling four and a half tablets. The initial two and a half to three days post-injury were the most painful.   Patient has been doing well, nonweightbearing.  Overall healthy.  No history of blood clots.  She does not smoke is not diabetic.   Past Medical History: Past Medical History:  Diagnosis Date  . Anemia, unspecified   . Breast cancer (CMS/HHS-HCC) 1992   with L-side Mastectomy with lymph node resection  . Carpal tunnel syndrome on right   . Chronic cough   . COPD (chronic obstructive pulmonary disease) (CMS/HHS-HCC)   . Essential hypertension, benign   . Family history of colon cancer   . GERD (gastroesophageal reflux disease)   . Hyponatremia    secondary to use of diuretic  . Osteoarthritis    ([facility]) a. Hands  b. Bilateral bunion  . Other and unspecified hyperlipidemia   . Uterine prolapse     Past Surgical History: Past Surgical History:  Procedure Laterality Date  . COLONOSCOPY  06/30/2013   10/01/2006. Repeat 10 years, Dr. Gaylyn.  . Colon @ [facility]  06/23/2023   Tubulovillous adenomas/Tubular  adenomas/Refer to [facility]/SMR  . EGD @ [facility]  06/23/2023   Hyperplastic gastric polyp/Fundic gland polyp/No repeat/SMR  . Bilateral thumb CMC fusion    . ENDOSCOPIC CARPAL TUNNEL RELEASE Right   . MASTECTOMY     secondary to breast cancer w/ lymph node resection    Past Family History: Family History  Problem Relation Age of Onset  . Stroke Mother   . Aneurysm Father   . Lung cancer Sister   . Cancer Paternal Grandmother 27       ?bladder vs colon    Medications: Current Outpatient Medications  Medication Sig Dispense Refill  . acetaminophen  (TYLENOL ) 650 MG ER tablet Take 650 mg by mouth every 8 (eight) hours as needed for Pain.    SABRA amLODIPine (NORVASC) 5 MG tablet TAKE 1 TABLET EVERY DAY 90 tablet 3  . cyanocobalamin (VITAMIN B12) 500 MCG tablet Take by mouth.    . HYDROcodone -chlorpheniramine (TUSSIONEX) 10-8 mg/5 mL ER suspension Take 5 mLs by mouth every 12 (twelve) hours 300 mL 0  . lovastatin (MEVACOR) 40 MG tablet TAKE 1 TABLET EVERY DAY WITH DINNER 90 tablet 3  . melatonin 10 mg Tab Take 10 mg by mouth nightly as needed       . NEXIUM 40 mg DR capsule TAKE ONE CAPSULE BY MOUTH ONCE DAILY 30 capsule 5  . propranoloL (INDERAL LA) 60 MG LA capsule TAKE 1 CAPSULE BY MOUTH ONCE DAILY 90 capsule 1  No current facility-administered medications for this visit.    Allergies: Allergies  Allergen Reactions  . Percocet [Oxycodone-Acetaminophen ] Unknown  . Quinapril-Hydrochlorothiazide Other (See Comments)    Potassium dropped  . Vicodin [Hydrocodone -Acetaminophen ] Unknown     Review of Systems:  A comprehensive 14 point ROS was performed, reviewed by me today, and the pertinent orthopaedic findings are documented in the HPI.   Exam: BP 116/60   Ht 152.4 cm (5')   Wt 45.4 kg (100 lb)   BMI 19.53 kg/m  General:  Well developed, well nourished, no apparent distress, presents in a wheelchair.  HEENT: Head normocephalic, atraumatic, PERRL.    Abdomen: Soft, non tender, non distended, Bowel sounds present.  Heart: Examination of the heart reveals regular, rate, and rhythm.  There is no murmur noted on ascultation.  There is a normal apical pulse.  Lungs: Lungs are clear to auscultation.  There is no wheeze, rhonchi, or crackles.  There is normal expansion of bilateral chest walls.     Left knee: Examination of the left knee shows no skin breakdown noted.  No warmth or redness.  She has mild swelling/effusion.  She is able to straight leg raise.  She has flexion to 90 degrees.  She is tender along the lateral tibial plateau.  She is stable to valgus and varus stress testing.  Negative Homans' sign.  Ankle plantarflexion dorsiflexion is intact.  Neuro vas intact in left lower extremity.     EXAM:  CT OF THE LEFT KNEE WITHOUT CONTRAST   TECHNIQUE:  Multidetector CT imaging of the left knee was performed according to  the standard protocol. Multiplanar CT image reconstructions were  also generated.   RADIATION DOSE REDUCTION: This exam was performed according to the  departmental dose-optimization program which includes automated  exposure control, adjustment of the mA and/or kV according to  patient size and/or use of iterative reconstruction technique.   COMPARISON:  Knee x-ray 05/10/2024   FINDINGS:  Bones/Joint/Cartilage   Large knee effusion with hematocrit level consistent with  hemarthrosis. No dislocation. Acute mildly comminuted  intra-articular fracture involving the lateral tibial plateau with  approximately 5.6 mm of depression. Fracture extends to the lateral  tibial spine. Suspect faint vertically oriented fracture  lucency/possible cleavage component extending to the metaphysis with  cortical breach, series 11, image 26, and sagittal series 13, image  30. No medial plateau fracture. Joint space calcifications  consistent with chondrocalcinosis. Mild medial tibiofemoral joint  space narrowing.  Minimal superior patellar spurring.   Ligaments   Suboptimally assessed by CT.   Muscles and Tendons   No intramuscular fluid collections.  No significant atrophy.   Soft tissues   Vascular calcifications. Edema within the subcutaneous soft tissues.   IMPRESSION:  1. Acute mildly comminuted intra-articular fracture involving the  lateral tibial plateau with approximately 5.6 mm of depression.  Associated large lipohemarthrosis.  2. Chondrocalcinosis.    Electronically Signed    By: Luke Bun M.D.    On: 05/10/2024 21:54        EXAM:  LEFT KNEE - COMPLETE 4+ VIEW   COMPARISON:  None Available.   FINDINGS:  Moderate lipohemarthrosis. Acute depressed fracture of the lateral  tibial plateau. Fracture lines extend into the intercondylar  tubercles. No definite extension into the medial tibial plateau or  tibial diaphysis.   IMPRESSION:  Acute depressed fracture of the lateral tibial plateau. Moderate  lipohemarthrosis     AP lateral views left knee ordered interpreted by me  in the office today.  Impression: Patient has a depressed lateral tibial plateau fracture.  Mild chondrocalcinosis noted in the medial compartment.  There appears to be some articular disruption of the lateral tibial plateau with displacement.  No distal femoral fracture.  Mild spurring along the patella.  No patella fracture.    Impression: Tibial plateau fracture, left, closed, initial encounter [S82.142A] Displaced lateral tibial plateau fracture, left, closed, initial encounter  (primary encounter diagnosis)  Plan:  Assessment & Plan Left lateral tibial plateau fracture with hemoarthrosis Mildly comminuted intraarticular fracture with 5.6 mm depression and small knee effusion. Pain managed with Advil and half of a hydrocodone  tablet.  CT scan shows significant depression of the lateral tibial plateau along the mid portion of the articular surface of the lateral compartment, greater than  5 mm of depression.  Patient is fairly active.  Did not have any knee pain prior to this fall and injury.  Risks, benefits, complications of a left lateral tibial plateau ORIF have been discussed with the patient.  Patient has agreed and consented to the procedure with Dr. Edie on 05/23/2024.       This note was generated in part with voice recognition software and I apologize for any typographical errors that were not detected and corrected.   Debby Lonni Amber MPA-C

## 2024-05-18 ENCOUNTER — Other Ambulatory Visit: Payer: Self-pay

## 2024-05-18 ENCOUNTER — Encounter
Admission: RE | Admit: 2024-05-18 | Discharge: 2024-05-18 | Disposition: A | Discharge status: Home / Self Care | Source: Ambulatory Visit | Attending: Surgery | Admitting: Surgery

## 2024-05-18 HISTORY — DX: Displaced fracture of medial condyle of left tibia, initial encounter for closed fracture: S82.132A

## 2024-05-18 NOTE — Patient Instructions (Addendum)
 Your procedure is scheduled on: 05/23/24 - Tuesday Report to the Registration Desk on the 1st floor of the [facility]. To find out your arrival time, please call [phone] between 1PM - 3PM on: 05/22/24 - Monday If your arrival time is 6:00 am, do not arrive before that time as the [facility] entrance doors do not open until 6:00 am.  REMEMBER: Instructions that are not followed completely may result in serious medical risk, up to and including death; or upon the discretion of your surgeon and anesthesiologist your surgery may need to be rescheduled.  Do not eat food after midnight the night before surgery.  No gum chewing or hard candies.  You may however, drink CLEAR liquids up to 2 hours before you are scheduled to arrive for your surgery. Do not drink anything within 2 hours of your scheduled arrival time.  Clear liquids include: - water  - apple juice without pulp - gatorade (not RED colors) - black coffee or tea (Do NOT add milk or creamers to the coffee or tea) Do NOT drink anything that is not on this list.  In addition, your doctor has ordered for you to drink the provided:  Ensure Pre-Surgery Clear Carbohydrate Drink  Drinking this carbohydrate drink up to two hours before surgery helps to reduce insulin resistance and improve patient outcomes. Please complete drinking 2 - 3 hours before scheduled arrival time.  One week prior to surgery: Stop Anti-inflammatories (NSAIDS) such as Advil, Aleve, Ibuprofen, Motrin, Naproxen, Naprosyn and Aspirin based products such as Excedrin, Goody's Powder, BC Powder. You may continue to take Tylenol  if needed for pain up until the day of surgery.  Stop ANY OVER THE COUNTER supplements until after surgery, PRESERVISION AREDS   ON THE DAY OF SURGERY ONLY TAKE THESE MEDICATIONS WITH SIPS OF WATER:  amLODipine (NORVASC)  esomeprazole (NEXIUM)  HYDROcodone -acetaminophen  if needed propranolol ER (INDERAL LA)    No Alcohol for 24  hours before or after surgery.  No Smoking including e-cigarettes for 24 hours before surgery.  No chewable tobacco products for at least 6 hours before surgery.  No nicotine patches on the day of surgery.  Do not use any recreational drugs for at least a week (preferably 2 weeks) before your surgery.  Please be advised that the combination of cocaine and anesthesia may have negative outcomes, up to and including death. If you test positive for cocaine, your surgery will be cancelled.  On the morning of surgery brush your teeth with toothpaste and water, you may rinse your mouth with mouthwash if you wish. Do not swallow any toothpaste or mouthwash.  Use CHG Soap or wipes as directed on instruction sheet.  Do not wear jewelry, make-up, hairpins, clips or nail polish.  For welded (permanent) jewelry: bracelets, anklets, waist bands, etc.  Please have this removed prior to surgery.  If it is not removed, there is a chance that hospital personnel will need to cut it off on the day of surgery.  Do not wear lotions, powders, or perfumes.   Do not shave body hair from the neck down 48 hours before surgery.  Contact lenses, hearing aids and dentures may not be worn into surgery.  Do not bring valuables to the hospital. [facility] is not responsible for any missing/lost belongings or valuables.   Notify your doctor if there is any change in your medical condition (cold, fever, infection).  Wear comfortable clothing (specific to your surgery type) to the hospital.  After  surgery, you can help prevent lung complications by doing breathing exercises.  Take deep breaths and cough every 1-2 hours. Your doctor may order a device called an Incentive Spirometer to help you take deep breaths.  When coughing or sneezing, hold a pillow firmly against your incision with both hands. This is called "splinting." Doing this helps protect your incision. It also decreases belly discomfort.  If you are  being admitted to the hospital overnight, leave your suitcase in the car. After surgery it may be brought to your room.  In case of increased patient census, it may be necessary for you, the patient, to continue your postoperative care in the Same Day Surgery department.  If you are being discharged the day of surgery, you will not be allowed to drive home. You will need a responsible individual to drive you home and stay with you for 24 hours after surgery.   If you are taking public transportation, you will need to have a responsible individual with you.  Please call the Pre-admissions Testing Dept. at [phone] if you have any questions about these instructions.  Surgery Visitation Policy:  Patients having surgery or a procedure may have two visitors.  Children under the age of 38 must have an adult with them who is not the patient.  Inpatient Visitation:    Visiting hours are 7 a.m. to 8 p.m. Up to four visitors are allowed at one time in a patient room. The visitors may rotate out with other people during the day.  One visitor age 43 or older may stay with the patient overnight and must be in the room by 8 p.m.   Merchandiser, retail to address health-related social needs:  [email or web address]                                                                                                            Preparing for Surgery with CHLORHEXIDINE GLUCONATE (CHG) Soap  Chlorhexidine Gluconate (CHG) Soap  o An antiseptic cleaner that kills germs and bonds with the skin to continue killing germs even after washing  o Used for showering the night before surgery and morning of surgery  Before surgery, you can play an important role by reducing the number of germs on your skin.  CHG (Chlorhexidine gluconate) soap is an antiseptic cleanser which kills germs and bonds with the skin to continue killing germs even after washing.  Please do not use if you have an  allergy to CHG or antibacterial soaps. If your skin becomes reddened/irritated stop using the CHG.  1. Shower the NIGHT BEFORE SURGERY and the MORNING OF SURGERY with CHG soap.  2. If you choose to wash your hair, wash your hair first as usual with your normal shampoo.  3. After shampooing, rinse your hair and body thoroughly to remove the shampoo.  4. Use CHG as you would any other liquid soap. You can apply CHG directly to the skin and wash gently with a scrungie or a clean washcloth.  5. Apply the CHG soap to  your body only from the neck down. Do not use on open wounds or open sores. Avoid contact with your eyes, ears, mouth, and genitals (private parts). Wash face and genitals (private parts) with your normal soap.  6. Wash thoroughly, paying special attention to the area where your surgery will be performed.  7. Thoroughly rinse your body with warm water.  8. Do not shower/wash with your normal soap after using and rinsing off the CHG soap.  9. Pat yourself dry with a clean towel.  10. Wear clean pajamas to bed the night before surgery.  12. Place clean sheets on your bed the night of your first shower and do not sleep with pets.  13. Shower again with the CHG soap on the day of surgery prior to arriving at the hospital.  14. Do not apply any deodorants/lotions/powders.  15. Please wear clean clothes to the hospital.   How to Use an Incentive Spirometer  An incentive spirometer is a tool that measures how well you are filling your lungs with each breath. Learning to take long, deep breaths using this tool can help you keep your lungs clear and active. This may help to reverse or lessen your chance of developing breathing (pulmonary) problems, especially infection. You may be asked to use a spirometer: After a surgery. If you have a lung problem or a history of smoking. After a long period of time when you have been unable to move or be active. If the spirometer includes an  indicator to show the highest number that you have reached, your health care provider or respiratory therapist will help you set a goal. Keep a log of your progress as told by your health care provider. What are the risks? Breathing too quickly may cause dizziness or cause you to pass out. Take your time so you do not get dizzy or light-headed. If you are in pain, you may need to take pain medicine before doing incentive spirometry. It is harder to take a deep breath if you are having pain. How to use your incentive spirometer  Sit up on the edge of your bed or on a chair. Hold the incentive spirometer so that it is in an upright position. Before you use the spirometer, breathe out normally. Place the mouthpiece in your mouth. Make sure your lips are closed tightly around it. Breathe in slowly and as deeply as you can through your mouth, causing the piston or the ball to rise toward the top of the chamber. Hold your breath for 3-5 seconds, or for as long as possible. If the spirometer includes a coach indicator, use this to guide you in breathing. Slow down your breathing if the indicator goes above the marked areas. Remove the mouthpiece from your mouth and breathe out normally. The piston or ball will return to the bottom of the chamber. Rest for a few seconds, then repeat the steps 10 or more times. Take your time and take a few normal breaths between deep breaths so that you do not get dizzy or light-headed. Do this every 1-2 hours when you are awake. If the spirometer includes a goal marker to show the highest number you have reached (best effort), use this as a goal to work toward during each repetition. After each set of 10 deep breaths, cough a few times. This will help to make sure that your lungs are clear. If you have an incision on your chest or abdomen from surgery, place a pillow or  a rolled-up towel firmly against the incision when you cough. This can help to reduce pain while  taking deep breaths and coughing. General tips When you are able to get out of bed: Walk around often. Continue to take deep breaths and cough in order to clear your lungs. Keep using the incentive spirometer until your health care provider says it is okay to stop using it. If you have been in the hospital, you may be told to keep using the spirometer at home. Contact a health care provider if: You are having difficulty using the spirometer. You have trouble using the spirometer as often as instructed. Your pain medicine is not giving enough relief for you to use the spirometer as told. You have a fever. Get help right away if: You develop shortness of breath. You develop a cough with bloody mucus from the lungs. You have fluid or blood coming from an incision site after you cough. Summary An incentive spirometer is a tool that can help you learn to take long, deep breaths to keep your lungs clear and active. You may be asked to use a spirometer after a surgery, if you have a lung problem or a history of smoking, or if you have been inactive for a long period of time. Use your incentive spirometer as instructed every 1-2 hours while you are awake. If you have an incision on your chest or abdomen, place a pillow or a rolled-up towel firmly against your incision when you cough. This will help to reduce pain. Get help right away if you have shortness of breath, you cough up bloody mucus, or blood comes from your incision when you cough. This information is not intended to replace advice given to you by your health care provider. Make sure you discuss any questions you have with your health care provider. Document Revised: 11/27/2019 Document Reviewed: 11/27/2019 Elsevier Patient Education  2023 [employer].

## 2024-05-19 ENCOUNTER — Encounter
Admission: RE | Admit: 2024-05-19 | Discharge: 2024-05-19 | Disposition: A | Discharge status: Home / Self Care | Source: Ambulatory Visit | Attending: Surgery | Admitting: Surgery

## 2024-05-19 ENCOUNTER — Ambulatory Visit: Payer: Self-pay | Admitting: Urgent Care

## 2024-05-19 ENCOUNTER — Telehealth: Payer: Self-pay | Admitting: Urgent Care

## 2024-05-19 DIAGNOSIS — R9431 Abnormal electrocardiogram [ECG] [EKG]: Secondary | ICD-10-CM | POA: Insufficient documentation

## 2024-05-19 DIAGNOSIS — S82143A Displaced bicondylar fracture of unspecified tibia, initial encounter for closed fracture: Secondary | ICD-10-CM

## 2024-05-19 DIAGNOSIS — E876 Hypokalemia: Secondary | ICD-10-CM | POA: Diagnosis not present

## 2024-05-19 DIAGNOSIS — Z01818 Encounter for other preprocedural examination: Secondary | ICD-10-CM | POA: Insufficient documentation

## 2024-05-19 DIAGNOSIS — Z01812 Encounter for preprocedural laboratory examination: Secondary | ICD-10-CM | POA: Diagnosis present

## 2024-05-19 DIAGNOSIS — Z01810 Encounter for preprocedural cardiovascular examination: Secondary | ICD-10-CM | POA: Diagnosis present

## 2024-05-19 LAB — CBC WITH DIFFERENTIAL/PLATELET
Abs Immature Granulocytes: 0.02 K/uL (ref 0.00–0.07)
Basophils Absolute: 0.1 K/uL (ref 0.0–0.1)
Basophils Relative: 1 %
Eosinophils Absolute: 0.1 K/uL (ref 0.0–0.5)
Eosinophils Relative: 1 %
HCT: 34.1 % — ABNORMAL LOW (ref 36.0–46.0)
Hemoglobin: 11.5 g/dL — ABNORMAL LOW (ref 12.0–15.0)
Immature Granulocytes: 0 %
Lymphocytes Relative: 34 %
Lymphs Abs: 1.6 K/uL (ref 0.7–4.0)
MCH: 32 pg (ref 26.0–34.0)
MCHC: 33.7 g/dL (ref 30.0–36.0)
MCV: 95 fL (ref 80.0–100.0)
Monocytes Absolute: 0.5 K/uL (ref 0.1–1.0)
Monocytes Relative: 11 %
Neutro Abs: 2.4 K/uL (ref 1.7–7.7)
Neutrophils Relative %: 53 %
Platelets: 657 K/uL — ABNORMAL HIGH (ref 150–400)
RBC: 3.59 MIL/uL — ABNORMAL LOW (ref 3.87–5.11)
RDW: 12.1 % (ref 11.5–15.5)
WBC: 4.6 K/uL (ref 4.0–10.5)
nRBC: 0 % (ref 0.0–0.2)

## 2024-05-19 LAB — URINALYSIS, ROUTINE W REFLEX MICROSCOPIC
Bilirubin Urine: NEGATIVE
Glucose, UA: NEGATIVE mg/dL
Hgb urine dipstick: NEGATIVE
Ketones, ur: NEGATIVE mg/dL
Leukocytes,Ua: NEGATIVE
Nitrite: NEGATIVE
Protein, ur: NEGATIVE mg/dL
Specific Gravity, Urine: 1.011 (ref 1.005–1.030)
pH: 6 (ref 5.0–8.0)

## 2024-05-19 LAB — COMPREHENSIVE METABOLIC PANEL WITH GFR
ALT: 12 U/L (ref 0–44)
AST: 21 U/L (ref 15–41)
Albumin: 3.5 g/dL (ref 3.5–5.0)
Alkaline Phosphatase: 74 U/L (ref 38–126)
Anion gap: 10 (ref 5–15)
BUN: 6 mg/dL — ABNORMAL LOW (ref 8–23)
CO2: 27 mmol/L (ref 22–32)
Calcium: 8.9 mg/dL (ref 8.9–10.3)
Chloride: 97 mmol/L — ABNORMAL LOW (ref 98–111)
Creatinine, Ser: 0.46 mg/dL (ref 0.44–1.00)
GFR, Estimated: >60 mL/min (ref >60)
Glucose, Bld: 116 mg/dL — ABNORMAL HIGH (ref 70–99)
Potassium: 2.6 mmol/L — CL (ref 3.5–5.1)
Sodium: 134 mmol/L — ABNORMAL LOW (ref 135–145)
Total Bilirubin: 0.5 mg/dL (ref 0.0–1.2)
Total Protein: 6.9 g/dL (ref 6.5–8.1)

## 2024-05-19 LAB — MAGNESIUM: Magnesium: 1.3 mg/dL — ABNORMAL LOW (ref 1.7–2.4)

## 2024-05-19 MED ORDER — POTASSIUM CHLORIDE CRYS ER 20 MEQ PO TBCR: EXTENDED_RELEASE_TABLET | ORAL | 0 refills | Status: AC

## 2024-05-19 MED ORDER — MAGNESIUM OXIDE 400 MG PO TABS: 400.0000 mg | ORAL_TABLET | Freq: Every day | ORAL | 0 refills | Status: AC

## 2024-05-19 NOTE — Progress Notes (Addendum)
 [facility] Perioperative Services: Pre-Admission/Anesthesia Testing  Abnormal Lab Notification and Treatment Plan of Care   Date: 05/19/24  Name: Lindsey Mccall DOB: 1950/10/13 MRN:   [id number]  Re: Abnormal labs noted during PAT appointment   Notified:  Provider Name Provider Role Notification Mode  Poggi, Norleen, MD Orthopedics (Surgeon) Routed and/or faxed via RANELL Auston Reyes JONETTA, MD Primary Care Provider Routed and/or faxed via [facility]   Clinical Information and Notes:  ABNORMAL LAB VALUE(S): Lab Results  Component Value Date   K 2.6 (LL) 05/19/2024   Lab Results  Component Value Date   MG 1.3 (L) 05/19/2024   Beverley FORBES [place] is scheduled for an OPEN REDUCTION INTERNAL FIXATION (ORIF) TIBIAL PLATEAU  on 05/23/2024. In review of her medication reconciliation, it is noted that the patient is not taking prescribed diuretic medications.   Please note, in efforts to promote a safe and effective anesthetic course, per current guidelines/standards set by the [facility] anesthesia team, the minimal acceptable K+ level for the patient to proceed with general anesthesia is 3.0 mmol/L. With that being said, if the patient drops any lower, her elective procedure will need to be postponed until K+ is better optimized. In efforts to prevent case cancellation, and ultimately to promote the safety of this patient undergoing sedation/anesthesia, will make efforts to optimize pre-surgical K+ level allowing the surgical intervention to proceed as planned.    Impression and Plan:  [name] found to be HYPOkalemic at 2.6 mmol/L on preoperative labs.   Mg level was added. A decrease in intracellular Mg is inversely proportional to the open ROMK channel. Low intracellular Mg results in an increase in the efflux of K+, which in turn can lead to an increase in both urinary and gastrointestinal wasting. Mg level found to be LOW at 1.3 mg/dL  She is not on diuretic therapy. Patient denies  diarrhea and laxative use. Discussed that, in the setting of poor Mg and K+, etiology of electrolyte derangement is likely secondary poor nutritional intake. Reviewed other potential causes such as warmer weather resulting in increased insensible losses. Reviewed plans for short term preoperative optimization. Based on an average of 0.12 mmol/L increase per 10 mEq oral KCl-, anticipate that this will optimize patient to the 4.0-4.2 mmol/L range (0.48 x 1) + (0.48 x 2) + (0.24 x 1) = 1.68 (anticipated increase) + 2.6 (current serum K+) 4.2 mmol/L). Rx(s) sent to pharmacy of record as follows:   Meds ordered this encounter  Medications   potassium chloride  SA (KLOR-CON  M) 20 MEQ tablet    Sig: Take 2 tablets (40 mEq) today. On Saturday and Sunday, take 1 tablet (20 mEq) twice a day. Take 1 tablet (20 mEq) before coming in for surgery on Monday. Follow up with PCP for repeat labs.    Dispense:  7 tablet    Refill:  0    Please contact the patient as soon as it is available for pickup. Rx is for preoperative K+ optimization and needs to be started ASAP.   magnesium  oxide (MAG-OX) 400 MG tablet    Sig: Take 1 tablet (400 mg total) by mouth daily.    Dispense:  30 tablet    Refill:  0   Encouraged patient to follow up with PCP about 2-3 weeks postoperatively to have labs rechecked to ensure that levels are remaining within normal range. Discussed nutritional intake of K+ rich foods as an adjunctive way to keep her K+ levels  normal; list of K+ rich foods provided. Also mentioned ORS, however advised her not to rely solely on these drinks, as they are high in Na+, and she has a HTN diagnosis.   Will send copy of this note to surgeon and PCP to make them aware of K+ level and plans for correction. Discussed that PCP may elect to adding daily K+  and Mg supplements if levels remain low on recheck. Order entered to recheck K+ on the day of her surgery to ensure optimization. Wished patient the best of luck with  her upcoming surgery and subsequent recovery. She was encouraged to return call to the PAT clinic, or to her surgeon's office, should any questions or concerns arise between now and the time of her surgery. Patient was appreciative of the care/concern expressed by PAT staff.   Encounter Diagnoses  Name Primary?   Pre-operative laboratory examination Yes   Hypokalemia    Hypomagnesemia    Dorise Pereyra, MSN, APRN, FNP-C, CEN [facility]  Perioperative Services Nurse Practitioner Phone: [phone] 05/19/24 12:55 PM  NOTE: This note has been prepared using Dragon dictation software. Despite my best ability to proofread, there is always the potential that unintentional transcriptional errors may still occur from this process.

## 2024-05-23 ENCOUNTER — Inpatient Hospital Stay

## 2024-05-23 ENCOUNTER — Encounter
Admission: RE | Disposition: A | Discharge status: Home Health | Payer: Self-pay | Source: Home / Self Care | Attending: Surgery

## 2024-05-23 ENCOUNTER — Other Ambulatory Visit: Payer: Self-pay

## 2024-05-23 ENCOUNTER — Inpatient Hospital Stay: Payer: Self-pay | Admitting: Urgent Care

## 2024-05-23 ENCOUNTER — Encounter: Payer: Self-pay | Admitting: Surgery

## 2024-05-23 ENCOUNTER — Inpatient Hospital Stay
Admission: RE | Admit: 2024-05-23 | Discharge: 2024-05-24 | DRG: 494 | Disposition: A | Discharge status: Home Health | Attending: Surgery | Admitting: Surgery

## 2024-05-23 DIAGNOSIS — S82142A Displaced bicondylar fracture of left tibia, initial encounter for closed fracture: Secondary | ICD-10-CM | POA: Diagnosis not present

## 2024-05-23 DIAGNOSIS — Z800 Family history of malignant neoplasm of digestive organs: Secondary | ICD-10-CM | POA: Diagnosis not present

## 2024-05-23 DIAGNOSIS — Z9012 Acquired absence of left breast and nipple: Secondary | ICD-10-CM | POA: Diagnosis not present

## 2024-05-23 DIAGNOSIS — Z801 Family history of malignant neoplasm of trachea, bronchus and lung: Secondary | ICD-10-CM | POA: Diagnosis not present

## 2024-05-23 DIAGNOSIS — Y92520 Airport as the place of occurrence of the external cause: Secondary | ICD-10-CM | POA: Diagnosis not present

## 2024-05-23 DIAGNOSIS — Z823 Family history of stroke: Secondary | ICD-10-CM

## 2024-05-23 DIAGNOSIS — M19041 Primary osteoarthritis, right hand: Secondary | ICD-10-CM | POA: Diagnosis present

## 2024-05-23 DIAGNOSIS — S82252A Displaced comminuted fracture of shaft of left tibia, initial encounter for closed fracture: Secondary | ICD-10-CM | POA: Diagnosis not present

## 2024-05-23 DIAGNOSIS — I10 Essential (primary) hypertension: Secondary | ICD-10-CM | POA: Diagnosis present

## 2024-05-23 DIAGNOSIS — Z888 Allergy status to other drugs, medicaments and biological substances status: Secondary | ICD-10-CM | POA: Diagnosis not present

## 2024-05-23 DIAGNOSIS — Z01812 Encounter for preprocedural laboratory examination: Secondary | ICD-10-CM

## 2024-05-23 DIAGNOSIS — Z853 Personal history of malignant neoplasm of breast: Secondary | ICD-10-CM

## 2024-05-23 DIAGNOSIS — K219 Gastro-esophageal reflux disease without esophagitis: Secondary | ICD-10-CM | POA: Diagnosis not present

## 2024-05-23 DIAGNOSIS — M19042 Primary osteoarthritis, left hand: Secondary | ICD-10-CM | POA: Diagnosis present

## 2024-05-23 DIAGNOSIS — E7800 Pure hypercholesterolemia, unspecified: Secondary | ICD-10-CM | POA: Diagnosis present

## 2024-05-23 DIAGNOSIS — Z885 Allergy status to narcotic agent status: Secondary | ICD-10-CM

## 2024-05-23 DIAGNOSIS — W1830XA Fall on same level, unspecified, initial encounter: Secondary | ICD-10-CM | POA: Diagnosis present

## 2024-05-23 DIAGNOSIS — J449 Chronic obstructive pulmonary disease, unspecified: Secondary | ICD-10-CM | POA: Diagnosis not present

## 2024-05-23 DIAGNOSIS — S82102A Unspecified fracture of upper end of left tibia, initial encounter for closed fracture: Secondary | ICD-10-CM | POA: Diagnosis not present

## 2024-05-23 DIAGNOSIS — E876 Hypokalemia: Secondary | ICD-10-CM

## 2024-05-23 HISTORY — PX: ORIF TIBIA PLATEAU: SHX2132

## 2024-05-23 LAB — POCT I-STAT, CHEM 8
BUN: 5 mg/dL — ABNORMAL LOW (ref 8–23)
Calcium, Ion: 1.2 mmol/L (ref 1.15–1.40)
Chloride: 93 mmol/L — ABNORMAL LOW (ref 98–111)
Creatinine, Ser: 0.5 mg/dL (ref 0.44–1.00)
Glucose, Bld: 93 mg/dL (ref 70–99)
HCT: 41 % (ref 36.0–46.0)
Hemoglobin: 13.9 g/dL (ref 12.0–15.0)
Potassium: 3.6 mmol/L (ref 3.5–5.1)
Sodium: 129 mmol/L — ABNORMAL LOW (ref 135–145)
TCO2: 22 mmol/L (ref 22–32)

## 2024-05-23 SURGERY — OPEN REDUCTION INTERNAL FIXATION (ORIF) TIBIAL PLATEAU
Anesthesia: General | Laterality: Left

## 2024-05-23 MED ORDER — GLYCOPYRROLATE 0.2 MG/ML IJ SOLN
INTRAMUSCULAR | Status: DC | PRN
  Administered 2024-05-23: .1 mg via INTRAVENOUS

## 2024-05-23 MED ORDER — DEXAMETHASONE SODIUM PHOSPHATE 10 MG/ML IJ SOLN
INTRAMUSCULAR | Status: DC | PRN
  Administered 2024-05-23: 5 mg via INTRAVENOUS

## 2024-05-23 MED ORDER — PHENYLEPHRINE 80 MCG/ML (10ML) SYRINGE FOR IV PUSH (FOR BLOOD PRESSURE SUPPORT)
PREFILLED_SYRINGE | INTRAVENOUS | Status: DC | PRN
  Administered 2024-05-23: 40 ug via INTRAVENOUS

## 2024-05-23 MED ORDER — KETOROLAC TROMETHAMINE 15 MG/ML IJ SOLN
INTRAMUSCULAR | Status: AC
  Filled 2024-05-23: qty 1

## 2024-05-23 MED ORDER — DIPHENHYDRAMINE HCL 12.5 MG/5ML PO ELIX: 12.5000 mg | ORAL_SOLUTION | ORAL | Status: DC | PRN

## 2024-05-23 MED ORDER — PROSIGHT PO TABS
1.0000 | ORAL_TABLET | Freq: Every day | ORAL | Status: DC
  Administered 2024-05-23 – 2024-05-24 (×2): 1 via ORAL
  Filled 2024-05-23 (×3): qty 1

## 2024-05-23 MED ORDER — FENTANYL CITRATE (PF) 100 MCG/2ML IJ SOLN
INTRAMUSCULAR | Status: AC
Start: 2024-05-23 — End: 2024-05-23
  Filled 2024-05-23: qty 2

## 2024-05-23 MED ORDER — TRAZODONE HCL 50 MG PO TABS
50.0000 mg | ORAL_TABLET | Freq: Every day | ORAL | Status: DC
  Administered 2024-05-23: 50 mg via ORAL
  Filled 2024-05-23: qty 1

## 2024-05-23 MED ORDER — KETOROLAC TROMETHAMINE 15 MG/ML IJ SOLN
15.0000 mg | Freq: Once | INTRAMUSCULAR | Status: AC
  Administered 2024-05-23: 15 mg via INTRAVENOUS

## 2024-05-23 MED ORDER — DROPERIDOL 2.5 MG/ML IJ SOLN: 0.6250 mg | Freq: Once | INTRAMUSCULAR | Status: DC | PRN

## 2024-05-23 MED ORDER — FLEET ENEMA RE ENEM: 1.0000 | ENEMA | Freq: Once | RECTAL | Status: DC | PRN

## 2024-05-23 MED ORDER — BISACODYL 10 MG RE SUPP: 10.0000 mg | Freq: Every day | RECTAL | Status: DC | PRN

## 2024-05-23 MED ORDER — CHLORHEXIDINE GLUCONATE 0.12 % MT SOLN
15.0000 mL | Freq: Once | OROMUCOSAL | Status: AC
  Administered 2024-05-23: 15 mL via OROMUCOSAL

## 2024-05-23 MED ORDER — CEFAZOLIN SODIUM-DEXTROSE 2-4 GM/100ML-% IV SOLN
INTRAVENOUS | Status: AC
Start: 2024-05-23 — End: 2024-05-23
  Filled 2024-05-23: qty 100

## 2024-05-23 MED ORDER — MELATONIN 5 MG PO TABS
10.0000 mg | ORAL_TABLET | Freq: Every day | ORAL | Status: DC
  Administered 2024-05-23: 10 mg via ORAL
  Filled 2024-05-23 (×2): qty 2

## 2024-05-23 MED ORDER — ACETAMINOPHEN 500 MG PO TABS
500.0000 mg | ORAL_TABLET | Freq: Four times a day (QID) | ORAL | Status: AC
  Administered 2024-05-23 – 2024-05-24 (×2): 500 mg via ORAL
  Filled 2024-05-23 (×2): qty 1

## 2024-05-23 MED ORDER — METOCLOPRAMIDE HCL 5 MG/ML IJ SOLN: 5.0000 mg | Freq: Three times a day (TID) | INTRAMUSCULAR | Status: DC | PRN

## 2024-05-23 MED ORDER — CEFAZOLIN SODIUM-DEXTROSE 2-4 GM/100ML-% IV SOLN
2.0000 g | INTRAVENOUS | Status: AC
  Administered 2024-05-23: 2 g via INTRAVENOUS

## 2024-05-23 MED ORDER — CHLORHEXIDINE GLUCONATE 0.12 % MT SOLN
OROMUCOSAL | Status: AC
  Filled 2024-05-23: qty 15

## 2024-05-23 MED ORDER — DEXMEDETOMIDINE HCL IN NACL 80 MCG/20ML IV SOLN
INTRAVENOUS | Status: DC | PRN
  Administered 2024-05-23: 4 ug via INTRAVENOUS

## 2024-05-23 MED ORDER — ACETAMINOPHEN 10 MG/ML IV SOLN
INTRAVENOUS | Status: AC
  Filled 2024-05-23: qty 100

## 2024-05-23 MED ORDER — MIDAZOLAM HCL 2 MG/2ML IJ SOLN
INTRAMUSCULAR | Status: DC | PRN
  Administered 2024-05-23: 1 mg via INTRAVENOUS

## 2024-05-23 MED ORDER — ACETAMINOPHEN 325 MG PO TABS: 325.0000 mg | ORAL_TABLET | Freq: Four times a day (QID) | ORAL | Status: DC | PRN

## 2024-05-23 MED ORDER — BUPIVACAINE HCL (PF) 0.5 % IJ SOLN
INTRAMUSCULAR | Status: AC
  Filled 2024-05-23: qty 30

## 2024-05-23 MED ORDER — HYDROCODONE-ACETAMINOPHEN 5-325 MG PO TABS
1.0000 | ORAL_TABLET | ORAL | Status: DC | PRN
  Administered 2024-05-23: 2 via ORAL
  Administered 2024-05-24: 1 via ORAL
  Administered 2024-05-24: 2 via ORAL
  Administered 2024-05-24: 1 via ORAL
  Filled 2024-05-23 (×2): qty 1
  Filled 2024-05-23 (×2): qty 2

## 2024-05-23 MED ORDER — ENOXAPARIN SODIUM 30 MG/0.3ML IJ SOSY
30.0000 mg | PREFILLED_SYRINGE | INTRAMUSCULAR | Status: DC
  Administered 2024-05-24: 30 mg via SUBCUTANEOUS
  Filled 2024-05-23: qty 0.3

## 2024-05-23 MED ORDER — TUSSIONEX PENNKINETIC ER 10-8 MG/5ML PO SUER
5.0000 mL | Freq: Every day | ORAL | Status: DC
  Filled 2024-05-23: qty 5

## 2024-05-23 MED ORDER — CEFAZOLIN SODIUM-DEXTROSE 2-4 GM/100ML-% IV SOLN
INTRAVENOUS | Status: AC
  Filled 2024-05-23: qty 100

## 2024-05-23 MED ORDER — ONDANSETRON HCL 4 MG/2ML IJ SOLN
INTRAMUSCULAR | Status: AC
  Filled 2024-05-23: qty 2

## 2024-05-23 MED ORDER — GLYCOPYRROLATE 0.2 MG/ML IJ SOLN
INTRAMUSCULAR | Status: AC
Start: 2024-05-23 — End: 2024-05-23
  Filled 2024-05-23: qty 1

## 2024-05-23 MED ORDER — DEXAMETHASONE SODIUM PHOSPHATE 10 MG/ML IJ SOLN
INTRAMUSCULAR | Status: AC
  Filled 2024-05-23: qty 1

## 2024-05-23 MED ORDER — HYDROMORPHONE HCL 1 MG/ML IJ SOLN
1.0000 mg | INTRAMUSCULAR | Status: DC | PRN
  Administered 2024-05-23: 1 mg via INTRAVENOUS

## 2024-05-23 MED ORDER — LIDOCAINE HCL (CARDIAC) PF 100 MG/5ML IV SOSY
PREFILLED_SYRINGE | INTRAVENOUS | Status: DC | PRN
Start: 2024-05-23 — End: 2024-05-23
  Administered 2024-05-23: 50 mg via INTRAVENOUS

## 2024-05-23 MED ORDER — PROPOFOL 10 MG/ML IV BOLUS
INTRAVENOUS | Status: DC | PRN
  Administered 2024-05-23: 30 mg via INTRAVENOUS
  Administered 2024-05-23: 20 mg via INTRAVENOUS
  Administered 2024-05-23: 80 mg via INTRAVENOUS

## 2024-05-23 MED ORDER — ONDANSETRON HCL 4 MG/2ML IJ SOLN: 4.0000 mg | Freq: Four times a day (QID) | INTRAMUSCULAR | Status: DC | PRN

## 2024-05-23 MED ORDER — ONDANSETRON HCL 4 MG PO TABS: 4.0000 mg | ORAL_TABLET | Freq: Four times a day (QID) | ORAL | Status: DC | PRN

## 2024-05-23 MED ORDER — OXYCODONE HCL 5 MG PO TABS
10.0000 mg | ORAL_TABLET | Freq: Once | ORAL | Status: AC
  Administered 2024-05-23: 10 mg via ORAL

## 2024-05-23 MED ORDER — FENTANYL CITRATE (PF) 100 MCG/2ML IJ SOLN
INTRAMUSCULAR | Status: DC | PRN
  Administered 2024-05-23 (×4): 25 ug via INTRAVENOUS

## 2024-05-23 MED ORDER — HYDROCODONE-ACETAMINOPHEN 5-325 MG PO TABS: 0.5000 | ORAL_TABLET | ORAL | 0 refills | Status: AC | PRN

## 2024-05-23 MED ORDER — CEFAZOLIN SODIUM-DEXTROSE 2-4 GM/100ML-% IV SOLN
2.0000 g | Freq: Four times a day (QID) | INTRAVENOUS | Status: AC
  Administered 2024-05-23 (×2): 2 g via INTRAVENOUS
  Filled 2024-05-23: qty 100

## 2024-05-23 MED ORDER — LIDOCAINE HCL (PF) 2 % IJ SOLN
INTRAMUSCULAR | Status: AC
  Filled 2024-05-23: qty 5

## 2024-05-23 MED ORDER — VITAMIN B-12 500 MCG PO TABS
500.0000 ug | ORAL_TABLET | Freq: Every day | ORAL | Status: DC
  Filled 2024-05-23: qty 1

## 2024-05-23 MED ORDER — VITAMIN B-12 1000 MCG PO TABS
500.0000 ug | ORAL_TABLET | Freq: Every day | ORAL | Status: DC
  Administered 2024-05-23 – 2024-05-24 (×2): 500 ug via ORAL
  Filled 2024-05-23 (×2): qty 1

## 2024-05-23 MED ORDER — PROPRANOLOL HCL ER 60 MG PO CP24
60.0000 mg | ORAL_CAPSULE | Freq: Every day | ORAL | Status: DC
  Administered 2024-05-24: 60 mg via ORAL
  Filled 2024-05-23: qty 1

## 2024-05-23 MED ORDER — PROPOFOL 10 MG/ML IV BOLUS
INTRAVENOUS | Status: AC
  Filled 2024-05-23: qty 20

## 2024-05-23 MED ORDER — 0.9 % SODIUM CHLORIDE (POUR BTL) OPTIME
TOPICAL | Status: DC | PRN
  Administered 2024-05-23: 500 mL

## 2024-05-23 MED ORDER — POTASSIUM CHLORIDE CRYS ER 20 MEQ PO TBCR
20.0000 meq | EXTENDED_RELEASE_TABLET | Freq: Two times a day (BID) | ORAL | Status: DC
  Administered 2024-05-23 – 2024-05-24 (×2): 20 meq via ORAL
  Filled 2024-05-23 (×2): qty 1

## 2024-05-23 MED ORDER — ONDANSETRON HCL 4 MG/2ML IJ SOLN
INTRAMUSCULAR | Status: DC | PRN
  Administered 2024-05-23: 4 mg via INTRAVENOUS

## 2024-05-23 MED ORDER — PRAVASTATIN SODIUM 20 MG PO TABS
40.0000 mg | ORAL_TABLET | Freq: Every day | ORAL | Status: DC
  Administered 2024-05-23 – 2024-05-24 (×2): 40 mg via ORAL
  Filled 2024-05-23 (×2): qty 2

## 2024-05-23 MED ORDER — LOSARTAN POTASSIUM 50 MG PO TABS
100.0000 mg | ORAL_TABLET | Freq: Every day | ORAL | Status: DC
  Administered 2024-05-24: 100 mg via ORAL
  Filled 2024-05-23 (×2): qty 2

## 2024-05-23 MED ORDER — ORAL CARE MOUTH RINSE: 15.0000 mL | Freq: Once | OROMUCOSAL | Status: AC

## 2024-05-23 MED ORDER — MAGNESIUM HYDROXIDE 400 MG/5ML PO SUSP: 30.0000 mL | Freq: Every day | ORAL | Status: DC | PRN

## 2024-05-23 MED ORDER — ACETAMINOPHEN 10 MG/ML IV SOLN
INTRAVENOUS | Status: DC | PRN
  Administered 2024-05-23: 1000 mg via INTRAVENOUS

## 2024-05-23 MED ORDER — AMLODIPINE BESYLATE 10 MG PO TABS
5.0000 mg | ORAL_TABLET | Freq: Every day | ORAL | Status: DC
  Administered 2024-05-24: 5 mg via ORAL
  Filled 2024-05-23: qty 1

## 2024-05-23 MED ORDER — PANTOPRAZOLE SODIUM 40 MG PO TBEC
40.0000 mg | DELAYED_RELEASE_TABLET | Freq: Every day | ORAL | Status: DC
  Administered 2024-05-24: 40 mg via ORAL
  Filled 2024-05-23: qty 1

## 2024-05-23 MED ORDER — HYDROMORPHONE HCL 1 MG/ML IJ SOLN
INTRAMUSCULAR | Status: AC
  Filled 2024-05-23: qty 1

## 2024-05-23 MED ORDER — DOCUSATE SODIUM 100 MG PO CAPS
100.0000 mg | ORAL_CAPSULE | Freq: Two times a day (BID) | ORAL | Status: DC
  Administered 2024-05-23 – 2024-05-24 (×2): 100 mg via ORAL
  Filled 2024-05-23 (×2): qty 1

## 2024-05-23 MED ORDER — METOCLOPRAMIDE HCL 10 MG PO TABS: 5.0000 mg | ORAL_TABLET | Freq: Three times a day (TID) | ORAL | Status: DC | PRN

## 2024-05-23 MED ORDER — MIDAZOLAM HCL 2 MG/2ML IJ SOLN
INTRAMUSCULAR | Status: AC
  Filled 2024-05-23: qty 2

## 2024-05-23 MED ORDER — FENTANYL CITRATE (PF) 100 MCG/2ML IJ SOLN
INTRAMUSCULAR | Status: AC
  Filled 2024-05-23: qty 2

## 2024-05-23 MED ORDER — LACTATED RINGERS IV SOLN: INTRAVENOUS | Status: DC

## 2024-05-23 MED ORDER — OXYCODONE HCL 5 MG PO TABS
ORAL_TABLET | ORAL | Status: AC
Start: 2024-05-23 — End: 2024-05-23
  Filled 2024-05-23: qty 2

## 2024-05-23 MED ORDER — SODIUM CHLORIDE 0.9 % IV SOLN: INTRAVENOUS | Status: AC

## 2024-05-23 MED ORDER — FENTANYL CITRATE (PF) 100 MCG/2ML IJ SOLN
25.0000 ug | INTRAMUSCULAR | Status: DC | PRN
  Administered 2024-05-23 (×2): 50 ug via INTRAVENOUS

## 2024-05-23 SURGICAL SUPPLY — 47 items
BIT DRILL 100X2.5XANTM LCK (BIT) IMPLANT
BNDG COHESIVE 4X5 TAN STRL LF (GAUZE/BANDAGES/DRESSINGS) ×1 IMPLANT
BNDG ELASTIC 6INX 5YD STR LF (GAUZE/BANDAGES/DRESSINGS) ×1 IMPLANT
BRACE KNEE POST OP SHORT (BRACE) IMPLANT
CHLORAPREP W/TINT 26 (MISCELLANEOUS) ×1 IMPLANT
COOLER POLAR GLACIER W/PUMP (MISCELLANEOUS) IMPLANT
CUFF TRNQT CYL 24X4X16.5-23 (TOURNIQUET CUFF) IMPLANT
CUFF TRNQT CYL 34X4.125X (TOURNIQUET CUFF) IMPLANT
DRAPE C-ARM XRAY 36X54 (DRAPES) ×1 IMPLANT
DRAPE C-ARMOR (DRAPES) ×1 IMPLANT
DRAPE INCISE IOBAN 66X45 STRL (DRAPES) ×1 IMPLANT
DRAPE SHEET LG 3/4 BI-LAMINATE (DRAPES) ×1 IMPLANT
DRAPE TABLE BACK 80X90 (DRAPES) ×1 IMPLANT
DRSG OPSITE POSTOP 4X6 (GAUZE/BANDAGES/DRESSINGS) IMPLANT
ELECT CAUTERY BLADE 6.4 (BLADE) ×1 IMPLANT
ELECTRODE REM PT RTRN 9FT ADLT (ELECTROSURGICAL) ×1 IMPLANT
GAUZE SPONGE 4X4 12PLY STRL (GAUZE/BANDAGES/DRESSINGS) ×1 IMPLANT
GAUZE XEROFORM 1X8 LF (GAUZE/BANDAGES/DRESSINGS) ×1 IMPLANT
GLOVE BIO SURGEON STRL SZ8 (GLOVE) ×2 IMPLANT
GLOVE INDICATOR 8.0 STRL GRN (GLOVE) ×1 IMPLANT
GOWN STRL REUS W/ TWL LRG LVL3 (GOWN DISPOSABLE) ×1 IMPLANT
GOWN STRL REUS W/ TWL XL LVL3 (GOWN DISPOSABLE) ×1 IMPLANT
GRAFT BNE CANC CHIPS 1-8 20CC (Bone Implant) IMPLANT
KIT TURNOVER KIT A (KITS) ×1 IMPLANT
KWIRE ACE 1.6X6 (WIRE) IMPLANT
MANIFOLD NEPTUNE II (INSTRUMENTS) ×1 IMPLANT
NS IRRIG 1000ML POUR BTL (IV SOLUTION) ×1 IMPLANT
PACK TOTAL KNEE (MISCELLANEOUS) ×1 IMPLANT
PAD CAST 4YDX4 CTTN HI CHSV (CAST SUPPLIES) ×2 IMPLANT
PAD COLD UNI WRAP-ON (PAD) IMPLANT
PLATE LOCK 3H STD LT PROX TIB (Plate) IMPLANT
PUTTY DBX 1CC DEPUY (Putty) IMPLANT
SCREW CORTICAL 3.5MM 36MM (Screw) IMPLANT
SCREW LOCK CORT STAR 3.5X24 (Screw) IMPLANT
SCREW LOCK CORT STAR 3.5X56 (Screw) IMPLANT
SCREW LOCK CORT STAR 3.5X60 (Screw) IMPLANT
SCREW LOCK CORT STAR 3.5X70 (Screw) IMPLANT
SCREW LP 3.5X75MM (Screw) IMPLANT
STAPLER SKIN PROX 35W (STAPLE) ×1 IMPLANT
STOCKINETTE M/LG 89821 (MISCELLANEOUS) ×1 IMPLANT
SUT PROLENE 4 0 PS 2 18 (SUTURE) ×2 IMPLANT
SUT VIC AB 0 CT1 36 (SUTURE) ×1 IMPLANT
SUT VIC AB 1 CT1 36 (SUTURE) ×1 IMPLANT
SUT VIC AB 2-0 CT1 TAPERPNT 27 (SUTURE) IMPLANT
TRAP FLUID SMOKE EVACUATOR (MISCELLANEOUS) ×1 IMPLANT
TRAY FOLEY MTR SLVR 16FR STAT (SET/KITS/TRAYS/PACK) IMPLANT
WATER STERILE IRR 500ML POUR (IV SOLUTION) ×1 IMPLANT

## 2024-05-23 NOTE — Evaluation (Signed)
 Physical Therapy Evaluation Patient Details Name: Lindsey Mccall MRN: [id number] DOB: 04/13/1951 Today's Date: 05/23/2024  History of Present Illness  Pt is a 73 yo F s/p mechanical fall and diagnosed with displaced comminuted split depression fracture of the left lateral tibial plateau now s/p ORIF 05/23/24.  PMH incudes: anemia, breast CA, COPD, chronic cough, HTN, and hyponatremia.   Clinical Impression  Pt was pleasant and motivated to participate during the session and put forth good effort throughout. Pt required extra time and effort with bed mobility tasks but no physical assistance. Pt provided multi-modal cuing during sit to/from stand transfer training and multi-directional gait training with a RW but required no physical assistance. Pt was able to ambulate with hop-to sequencing 5 feet and then 15 feet with very good upper body control and with LLE NWB compliance throughout.  Pt reported no adverse symptoms during the session other than LLE pain with SpO2 and HR WNL throughout on room air.  Pt will benefit from continued PT services upon discharge to safely address deficits listed in patient problem list for decreased caregiver assistance and eventual return to PLOF.             If plan is discharge home, recommend the following: A little help with walking and/or transfers;A little help with bathing/dressing/bathroom;Assistance with cooking/housework;Assist for transportation;Help with stairs or ramp for entrance   Can travel by private vehicle        Equipment Recommendations None recommended by PT  Recommendations for Other Services       Functional Status Assessment Patient has had a recent decline in their functional status and demonstrates the ability to make significant improvements in function in a reasonable and predictable amount of time.     Precautions / Restrictions Precautions Precautions: Fall Required Braces or Orthoses: Knee Immobilizer -  Left Restrictions Weight Bearing Restrictions Per Provider Order: Yes LLE Weight Bearing Per Provider Order: Non weight bearing Other Position/Activity Restrictions: Patient may unlock hinges for active and passive knee range of motion exercises, but hinges need to be locked in extension for ambulation      Mobility  Bed Mobility Overal bed mobility: Modified Independent             General bed mobility comments: Min extra time and effort only during sup to/from sit    Transfers Overall transfer level: Needs assistance Equipment used: Rolling walker (2 wheels) Transfers: Sit to/from Stand Sit to Stand: Contact guard assist           General transfer comment: Mod multi-modal cues for sequencing with good eccentric and concentric control and stability    Ambulation/Gait Ambulation/Gait assistance: Contact guard assist Gait Distance (Feet): 5 Feet x 1, 15 Feet x 1 Assistive device: Rolling walker (2 wheels)   Gait velocity: decreased     General Gait Details: Hop-to pattern with mod multi-modal cues for proper sequencing with the RW during forwards, backwards, and side-stepping gait as well as 180 deg turns within the RW; generally steady with no overt LOB and with full compliance with WB restrictions  Stairs            Wheelchair Mobility     Tilt Bed    Modified Rankin (Stroke Patients Only)       Balance Overall balance assessment: Needs assistance, History of Falls   Sitting balance-Leahy Scale: Normal     Standing balance support: Bilateral upper extremity supported, During functional activity Standing balance-Leahy Scale: Good  Pertinent Vitals/Pain Pain Assessment Pain Assessment: 0-10 Pain Score: 5  Pain Location: L knee Pain Descriptors / Indicators: Sore Pain Intervention(s): Premedicated before session, Monitored during session, Repositioned, Ice applied    Home Living Family/patient  expects to be discharged to:: Private residence Living Arrangements: Children Available Help at Discharge: Family;Available 24 hours/day;Available PRN/intermittently Type of Home: House Home Access: Stairs to enter Entrance Stairs-Rails: Left Entrance Stairs-Number of Steps: 1   Home Layout: Two level;Able to live on main level with bedroom/bathroom Home Equipment: BSC/3in1;Rolling Walker (2 wheels);Rollator (4 wheels);Wheelchair - manual;Crutches Additional Comments: Pt to discharge home to son's house.  Son and daughter-in-law available 24/7 initially upon discharge until the weekend then intermittent assist    Prior Function Prior Level of Function : Independent/Modified Independent;History of Falls (last six months)             Mobility Comments: Ind amb community distances without an AD, active with walking and shopping, no other fall history ADLs Comments: Ind with ADLs     Extremity/Trunk Assessment   Upper Extremity Assessment Upper Extremity Assessment: Overall WFL for tasks assessed    Lower Extremity Assessment Lower Extremity Assessment: LLE deficits/detail LLE: Unable to fully assess due to pain;Unable to fully assess due to immobilization       Communication   Communication Communication: No apparent difficulties    Cognition Arousal: Alert Behavior During Therapy: WFL for tasks assessed/performed   PT - Cognitive impairments: No apparent impairments                         Following commands: Intact       Cueing Cueing Techniques: Verbal cues, Visual cues, Tactile cues     General Comments      Exercises Other Exercises Other Exercises: Car transfer sequencing education Other Exercises: Pt and family verbal education on proper sequencing ascending/descending one step with a wheelchair   Assessment/Plan    PT Assessment Patient needs continued PT services  PT Problem List Decreased strength;Decreased range of motion;Decreased  activity tolerance;Decreased balance;Decreased mobility;Decreased knowledge of use of DME;Decreased knowledge of precautions;Pain       PT Treatment Interventions DME instruction;Gait training;Functional mobility training;Therapeutic activities;Therapeutic exercise;Balance training;Patient/family education    PT Goals (Current goals can be found in the Care Plan section)  Acute Rehab PT Goals Patient Stated Goal: To get out of the house and go shopping PT Goal Formulation: With patient Time For Goal Achievement: 06/05/24 Potential to Achieve Goals: Good    Frequency BID     Co-evaluation               AM-PAC PT 6 Clicks Mobility  Outcome Measure Help needed turning from your back to your side while in a flat bed without using bedrails?: None Help needed moving from lying on your back to sitting on the side of a flat bed without using bedrails?: None Help needed moving to and from a bed to a chair (including a wheelchair)?: A Little Help needed standing up from a chair using your arms (e.g., wheelchair or bedside chair)?: A Little Help needed to walk in hospital room?: A Little Help needed climbing 3-5 steps with a railing? : A Lot 6 Click Score: 19    End of Session Equipment Utilized During Treatment: Gait belt Activity Tolerance: Patient tolerated treatment well Patient left: in bed;with call bell/phone within reach;with bed alarm set;with nursing/sitter in room;with family/visitor present Nurse Communication: Mobility status;Weight bearing status PT Visit  Diagnosis: History of falling (Z91.81);Other abnormalities of gait and mobility (R26.89);Muscle weakness (generalized) (M62.81);Pain Pain - Right/Left: Left Pain - part of body: Knee    Time: 8653-8566 PT Time Calculation (min) (ACUTE ONLY): 47 min   Charges:   PT Evaluation $PT Eval Moderate Complexity: 1 Mod PT Treatments $Gait Training: 8-22 mins $Therapeutic Activity: 8-22 mins PT General Charges $$  ACUTE PT VISIT: 1 Visit       D. Scott Linzee Depaul PT, DPT 05/23/24, 2:57 PM

## 2024-05-23 NOTE — Progress Notes (Signed)
 PHARMACIST - PHYSICIAN COMMUNICATION  CONCERNING:  Enoxaparin  (Lovenox ) for DVT Prophylaxis    RECOMMENDATION: Patient was prescribed enoxaprin 40mg  q24 hours for VTE prophylaxis.   Filed Weights   05/23/24 0832  Weight: 44.5 kg (98 lb)    Body mass index is 19.14 kg/m.  Estimated Creatinine Clearance: 43.3 mL/min (by C-G formula based on SCr of 0.5 mg/dL).   Patient is candidate for enoxaparin  30mg  every 24 hours based on Weight <45kg  DESCRIPTION: Pharmacy has adjusted enoxaparin  dose per [facility] policy.  Patient is now receiving enoxaparin  30 mg every 24 hours    Lindsey Mccall, PharmD Clinical Pharmacist  05/23/2024 6:10 PM

## 2024-05-23 NOTE — H&P (Signed)
 History of Present Illness: Lindsey Mccall is a 73 year old female who presents with a left knee injury following a fall.  [place]. The knee struck the hard floor, causing significant pain and swelling. Imaging in the emergency room revealed a depressed fracture along the lateral tibial plateau, a large knee effusion, hemoarthrosis, and a mildly comminuted intraarticular fracture with 5.6 mm of depression. She wears a brace and applies ice to the knee two to three times daily but continues to experience persistent swelling. Her mobility is significantly reduced from her previous activity level of walking three to four times a week. For pain, she takes Advil and hydrocodone , using half a tablet at a time, totaling four and a half tablets. The initial two and a half to three days post-injury were the most painful. Patient has been doing well, nonweightbearing. Overall healthy. No history of blood clots. She does not smoke is not diabetic.  Past Medical History: Anemia, unspecified  Breast cancer (CMS/HHS-HCC) 1992 (with L-side Mastectomy with lymph node resection)  Carpal tunnel syndrome on right  Chronic cough  COPD (chronic obstructive pulmonary disease) (CMS/HHS-HCC)  Essential hypertension, benign  Family history of colon cancer  GERD (gastroesophageal reflux disease)  Hyponatremia (secondary to use of diuretic)  Osteoarthritis ((GWK) a. Hands b. Bilateral bunion)  Hyperlipidemia  Uterine prolapse   Past Surgical History: COLONOSCOPY 06/30/2013 (10/01/2006. Repeat 10 years, Dr. Gaylyn)  Colon @ [facility] 06/23/2023 (Tubulovillous adenomas/Tubular adenomas/Refer to Advanced Endoscopy/SMR)  EGD @ [facility] 06/23/2023 (Hyperplastic gastric polyp/Fundic gland polyp/No repeat/SMR)  Bilateral thumb CMC fusion  ENDOSCOPIC CARPAL TUNNEL RELEASE Right  MASTECTOMY (secondary to breast cancer w/ lymph node resection)   Past Family History: Stroke  Mother  Aneurysm Father  Lung cancer Sister  Cancer Paternal Grandmother 73 (?bladder vs colon)   Medications: acetaminophen  (TYLENOL ) 650 MG ER tablet Take 650 mg by mouth every 8 (eight) hours as needed for Pain.  amLODIPine  (NORVASC ) 5 MG tablet TAKE 1 TABLET EVERY DAY 90 tablet 3  cyanocobalamin  (VITAMIN B12) 500 MCG tablet Take by mouth.  HYDROcodone -chlorpheniramine (TUSSIONEX) 10-8 mg/5 mL ER suspension Take 5 mLs by mouth every 12 (twelve) hours 300 mL 0  lovastatin (MEVACOR) 40 MG tablet TAKE 1 TABLET EVERY DAY WITH DINNER 90 tablet 3  melatonin 10 mg Tab Take 10 mg by mouth nightly as needed  NEXIUM 40 mg DR capsule TAKE ONE CAPSULE BY MOUTH ONCE DAILY 30 capsule 5  propranoloL  (INDERAL  LA) 60 MG LA capsule TAKE 1 CAPSULE BY MOUTH ONCE DAILY 90 capsule 1   Allergies: Percocet [Oxycodone -Acetaminophen ] Unknown  Quinapril-Hydrochlorothiazide Other (Potassium dropped)  Vicodin [Hydrocodone -Acetaminophen ] Unknown   Review of Systems:  A comprehensive 14 point ROS was performed, reviewed by me today, and the pertinent orthopaedic findings are documented in the HPI.  Physical Exam: BP 116/60  Ht 152.4 cm (5')  Wt 45.4 kg (100 lb)  BMI 19.53 kg/m  General:  Well developed, well nourished, no apparent distress, presents in a wheelchair.  HEENT: Head normocephalic, atraumatic, PERRL.   Abdomen: Soft, non tender, non distended, Bowel sounds present.  Heart: Examination of the heart reveals regular, rate, and rhythm. There is no murmur noted on ascultation. There is a normal apical pulse.  Lungs: Lungs are clear to auscultation. There is no wheeze, rhonchi, or crackles. There is normal expansion of bilateral chest walls.   Left knee: Examination of the left knee shows no skin breakdown noted.  No warmth or redness. She has mild swelling/effusion. She is able to straight leg raise. She has flexion to 90 degrees. She is tender along the lateral tibial plateau. She is stable  to valgus and varus stress testing. Negative Homans' sign. Ankle plantarflexion dorsiflexion is intact. Neuro vas intact in left lower extremity.  Imaging: CT OF THE LEFT KNEE WITHOUT CONTRAST:  1. Acute mildly comminuted intra-articular fracture involving the  lateral tibial plateau with approximately 5.6 mm of depression.  Associated large lipohemarthrosis.  2. Chondrocalcinosis.   AP lateral views left knee ordered interpreted by me in the office today. Impression: Patient has a depressed lateral tibial plateau fracture. Mild chondrocalcinosis noted in the medial compartment. There appears to be some articular disruption of the lateral tibial plateau with displacement. No distal femoral fracture. Mild spurring along the patella. No patella fracture.  Impression: Closed displaced left lateral tibial plateau fracture.  Plan: The treatment options, including both surgical and nonsurgical choices, have been discussed in detail with the patient and her family.  She would like to proceed with surgical intervention to include open reduction internal fixation of her displaced left lateral tibial plateau fracture.  The risks (including bleeding, infection, nerve and/or blood vessel injury, persistent or recurrent pain, loosening or failure of the components, malunion and/or nonunion, need for further surgery, blood clots, strokes, heart attacks or arrhythmias, pneumonia, etc.) and benefits of the surgical procedure were discussed.  The patient states her understanding and agrees to proceed.  A formal written consent will be obtained by the nursing staff.    H&P reviewed and patient re-examined. No changes.

## 2024-05-23 NOTE — Anesthesia Procedure Notes (Signed)
 Procedure Name: LMA Insertion Date/Time: 05/23/2024 10:28 AM  Performed by: Lacretia Camelia NOVAK, CRNAPre-anesthesia Checklist: Patient identified, Emergency Drugs available, Suction available and Patient being monitored Patient Re-evaluated:Patient Re-evaluated prior to induction Oxygen Delivery Method: Circle system utilized Preoxygenation: Pre-oxygenation with 100% oxygen Induction Type: IV induction Ventilation: Mask ventilation without difficulty LMA: LMA inserted LMA Size: 3.0 Number of attempts: 1 Placement Confirmation: positive ETCO2 Tube secured with: Tape Dental Injury: Teeth and Oropharynx as per pre-operative assessment

## 2024-05-23 NOTE — Anesthesia Preprocedure Evaluation (Signed)
 Anesthesia Evaluation  Patient identified by MRN, date of birth, ID band Patient awake    Reviewed: Allergy & Precautions, H&P , NPO status , Patient's Chart, lab work & pertinent test results, reviewed documented beta blocker date and time   History of Anesthesia Complications (+) history of anesthetic complications  Airway Mallampati: III  TM Distance: >3 FB Neck ROM: full    Dental  (+) Dental Advidsory Given, Implants, Upper Dentures, Lower Dentures   Pulmonary neg shortness of breath, neg sleep apnea, COPD, neg recent URI, former smoker   Pulmonary exam normal breath sounds clear to auscultation       Cardiovascular Exercise Tolerance: Good hypertension, (-) angina (-) Past MI and (-) Cardiac Stents Normal cardiovascular exam(-) dysrhythmias (-) Valvular Problems/Murmurs Rhythm:regular Rate:Normal     Neuro/Psych negative neurological ROS  negative psych ROS   GI/Hepatic Neg liver ROS,GERD  ,,  Endo/Other  negative endocrine ROS    Renal/GU negative Renal ROS  negative genitourinary   Musculoskeletal   Abdominal   Peds  Hematology negative hematology ROS (+)   Anesthesia Other Findings Past Medical History: 1993: Breast cancer, left (HCC) No date: Complication of anesthesia     Comment:  slow to wake up after colonoscopy No date: COPD (chronic obstructive pulmonary disease) (HCC) No date: GERD (gastroesophageal reflux disease) No date: Hypercholesteremia No date: Hypertension No date: Left medial tibial plateau fracture No date: Wears dentures     Comment:  full upper, implants lower   Reproductive/Obstetrics negative OB ROS                              Anesthesia Physical Anesthesia Plan  ASA: 2  Anesthesia Plan: General   Post-op Pain Management:    Induction: Intravenous  PONV Risk Score and Plan: 3 and Ondansetron , Dexamethasone  and Treatment may vary due to age  or medical condition  Airway Management Planned: LMA  Additional Equipment:   Intra-op Plan:   Post-operative Plan: Extubation in OR  Informed Consent: I have reviewed the patients History and Physical, chart, labs and discussed the procedure including the risks, benefits and alternatives for the proposed anesthesia with the patient or authorized representative who has indicated his/her understanding and acceptance.     Dental Advisory Given  Plan Discussed with: Anesthesiologist, CRNA and Surgeon  Anesthesia Plan Comments:         Anesthesia Quick Evaluation

## 2024-05-23 NOTE — Op Note (Signed)
 05/23/2024  12:06 PM  Patient:   Lindsey Mccall  Pre-Op Diagnosis:   Displaced comminuted split depression fracture, left lateral tibial plateau.  Post-Op Diagnosis:   Same.  Procedure:   Open reduction and internal fixation of displaced split depression fracture, left lateral tibial plateau.  Surgeon:   DOROTHA Reyes Maltos, MD  Assistant:   None  Anesthesia:   General LMA  Findings:   As above.  Complications:   None  EBL:   2 cc  Fluids:   800 cc crystalloid  TT:   71 minutes at 300 mmHg  Drains:   None  Closure:   Staples  Implants:   Biomet 3-hole precontoured lateral tibial plateau plate  Brief Clinical Note:   The patient is a 73 year old female who sustained above-noted injury nearly 2 weeks ago [place]. She presented to a local emergency room where x-rays demonstrated the above-noted injury. She was placed in a knee immobilizer, then elected to follow-up locally. She was seen in our orthopedic clinic last week her x-rays again confirm the above-noted injury. The patient presents at this time for definitive management of this injury.  Procedure:   The patient was brought into the operating room and laid in the supine position. After adequate general laryngeal mask anesthesia was obtained, the left lower extremity was prepped with ChloraPrep solution before being draped sterilely. Preoperative antibiotics were administered. A timeout was obtained to verify the appropriate surgical site before the limb was exsanguinated with an Esmarch and the tourniquet inflated to 300 mmHg.   An approximately 7-8 cm curvilinear incision was made over the lateral aspect of the tibial plateau. The incision was carried down through the subcutaneous tissues to expose the iliotibial band proximally and the fascia overlying the anterior compartment distally. The structures were released in line with the incision and the anterior compartment musculature elevated  subperiosteally. Proximally, the tissues were elevated off the lateral aspect of the tibial plateau in order to identify the joint line. The lateral meniscus was elevated by releasing the tibial attachment anteriorly and laterally in order to visualize the articular surface better.   The fracture was hinged opened anteriorly. The large fragments containing articular cartilage were identified and elevated using a bone tamp. Approximately 5-6 cc of cancellous chips were impacted into the fracture beneath the articular fragments in order to better support the articular fragment before 0.5 cc of bone putty was injected into the defect to help with healing. The adequacy of fracture reduction was verified fluoroscopically in AP and lateral projections and found to be excellent.   The appropriate sized plate was selected and placed against the lateral tibial cortex and positioned appropriately before being secured temporarily with a K wire proximally. Again the adequacy of plate position and fracture overall fracture reduction was verified fluoroscopically in AP and lateral projections before the plate was secured using one nonlocking cortical screw and 2 locking screws distally and four additional locking 4.0 mm cancellus screws proximally. Initially, a nonlocking screw was placed to help temporarily stabilize the plate and fracture proximally, but this was removed and replaced with a locking screw. The final construct was assessed fluoroscopically in AP and lateral projections and found to be near anatomic with excellent reduction of the fracture and excellent position of the hardware.  The wound was copiously irrigated with bacitracin saline solution using bulb irrigation before the lateral meniscus was reattached to the superior margin of the plate through  the suture holes using #0 Vicryl interrupted sutures. The subcutaneous tissues were closed in two layers using 2-0 Vicryl interrupted sutures before the skin  was closed using staples. A occlusive dressing was applied to the knee before a Polar Care pad was applied over an Ace wrap. The patient's leg was placed into a hinged knee brace with the hinges set at 0-90 but locked in extension. The patient was then awakened, extubated, and returned to the recovery room in satisfactory condition after tolerating the procedure well.

## 2024-05-23 NOTE — Discharge Instructions (Addendum)
 Orthopedic discharge instructions: May bathe with intact OpSite dressing. Apply ice frequently to knee or use Polar Care. Take one aspirin (325 mg) twice dialy for two weeks and then daily until 6 weeks. Resume ibuprofen 600 mg TID with meals for 3-5 days, then as needed. Take pain medication as prescribed when needed.  May supplement with ES Tylenol  if necessary. No weightbearing on left leg - keep leg in hinged knee brace and use walker for ambulation. Follow-up in 10-14 days or as scheduled.

## 2024-05-23 NOTE — Transfer of Care (Signed)
 Immediate Anesthesia Transfer of Care Note  Patient: Lindsey Mccall  Procedure(s) Performed: OPEN REDUCTION INTERNAL FIXATION (ORIF) TIBIAL PLATEAU (Left)  Patient Location: PACU  Anesthesia Type:General  Level of Consciousness: awake, alert , and oriented  Airway & Oxygen Therapy: Patient Spontanous Breathing and Patient connected to face mask oxygen  Post-op Assessment: Report given to RN and Post -op Vital signs reviewed and stable  Post vital signs: Reviewed and stable  Last Vitals:  Vitals Value Taken Time  BP 149/106 05/23/24 12:02  Temp    Pulse 91 05/23/24 12:05  Resp 25 05/23/24 12:05  SpO2 100 % 05/23/24 12:05  Vitals shown include unfiled device data.  Last Pain:  Vitals:   05/23/24 0832  TempSrc: Temporal  PainSc: 5          Complications: No notable events documented.

## 2024-05-24 ENCOUNTER — Encounter: Payer: Self-pay | Admitting: Surgery

## 2024-05-24 MED ORDER — HYDROCOD POLI-CHLORPHE POLI ER 10-8 MG/5ML PO SUER
5.0000 mL | Freq: Every day | ORAL | Status: DC
  Administered 2024-05-24: 5 mL via ORAL
  Filled 2024-05-24: qty 5

## 2024-05-24 NOTE — Progress Notes (Signed)
  Subjective: 1 Day Post-Op Procedure(s) (LRB): OPEN REDUCTION INTERNAL FIXATION (ORIF) TIBIAL PLATEAU (Left) Patient reports pain as mild.   Patient is well, and has had no acute complaints or problems Plan is to go Home after hospital stay. Negative for chest pain and shortness of breath Fever: no Gastrointestinal:Negative for nausea and vomiting  Objective: Vital signs in last 24 hours: Temp:  [97.1 F (36.2 C)-98.3 F (36.8 C)] 97.8 F (36.6 C) (09/03 0718) Pulse Rate:  [76-96] 96 (09/03 0718) Resp:  [15-21] 17 (09/03 0718) BP: (112-159)/(51-97) 125/73 (09/03 0718) SpO2:  [93 %-100 %] 96 % (09/03 0718) Weight:  [44.5 kg] 44.5 kg (09/02 0832)  Intake/Output from previous day:  Intake/Output Summary (Last 24 hours) at 05/24/2024 0755 Last data filed at 05/23/2024 1645 Gross per 24 hour  Intake 1865 ml  Output 327 ml  Net 1538 ml    Intake/Output this shift: No intake/output data recorded.  Labs: Recent Labs    05/23/24 0852  HGB 13.9   Recent Labs    05/23/24 0852  HCT 41.0   Recent Labs    05/23/24 0852  NA 129*  K 3.6  CL 93*  BUN 5*  CREATININE 0.50  GLUCOSE 93   No results for input(s): LABPT, INR in the last 72 hours.   EXAM General - Patient is Alert, Appropriate, and Oriented Extremity - ABD soft Neurovascular intact Dorsiflexion/Plantar flexion intact Incision: dressing C/D/I No cellulitis present Compartment soft Dressing/Incision - clean, dry, no drainage noted to the left knee honeycomb.  Knee ROM brace intact and locked in extension. Motor Function - intact, moving foot and toes well on exam.  Abdomen soft with intact bowel sounds.  Past Medical History:  Diagnosis Date   Breast cancer, left (HCC) 1993   Complication of anesthesia    slow to wake up after colonoscopy   COPD (chronic obstructive pulmonary disease) (HCC)    GERD (gastroesophageal reflux disease)    Hypercholesteremia    Hypertension    Left medial tibial  plateau fracture    Wears dentures    full upper, implants lower    Assessment/Plan: 1 Day Post-Op Procedure(s) (LRB): OPEN REDUCTION INTERNAL FIXATION (ORIF) TIBIAL PLATEAU (Left) Principal Problem:   Tibial plateau fracture, left  Estimated body mass index is 19.14 kg/m as calculated from the following:   Height as of this encounter: 5' (1.524 m).   Weight as of this encounter: 44.5 kg. Advance diet Up with therapy D/C IV fluids when tolerating po intake.  Labs and vitals reviewed. Up with therapy today. Plan for d/c home with HHPT, will need a bedside commode prior to discharge.  DVT Prophylaxis - Lovenox  NWB to the left leg. Locked in extension for ambulation, can unlock and work on gentle ROM when sitting.  Lindsey Gustavo Level, PA-C [facility] Orthopaedic Surgery 05/24/2024, 7:55 AM

## 2024-05-24 NOTE — Discharge Summary (Signed)
 Physician Discharge Summary  Patient ID: VALMA ROTENBERG MRN: [id number] DOB/AGE: 1950/10/30 73 y.o.  Admit date: 05/23/2024 Discharge date: 05/24/2024  Admission Diagnoses:  Tibial plateau fracture, left, closed, initial encounter [S82.142A] Tibial plateau fracture, left [S82.142A]  Discharge Diagnoses: Patient Active Problem List   Diagnosis Date Noted   Tibial plateau fracture, left 05/23/2024   Anemia, unspecified 05/22/2020   Benign essential hypertension 05/22/2020   Carpal tunnel syndrome on right 05/22/2020   Chronic cough 05/22/2020   Family history of colon cancer 05/22/2020   Osteoarthritis 05/22/2020   Pure hypercholesterolemia 05/22/2020   Uterine prolapse 05/22/2020   Degenerative tear of glenoid labrum of left shoulder 11/03/2019   Nontraumatic incomplete tear of left rotator cuff 11/03/2019   Rotator cuff tendinitis, left 11/03/2019   Tendinitis of upper biceps tendon of left shoulder 11/03/2019   Primary osteoarthritis of both hands 02/17/2018   Trochanteric bursitis, right hip 02/17/2018   Chronic thumb pain, right 07/21/2017   COPD (chronic obstructive pulmonary disease) (HCC) 03/26/2014    Past Medical History:  Diagnosis Date   Breast cancer, left (HCC) 1993   Complication of anesthesia    slow to wake up after colonoscopy   COPD (chronic obstructive pulmonary disease) (HCC)    GERD (gastroesophageal reflux disease)    Hypercholesteremia    Hypertension    Left medial tibial plateau fracture    Wears dentures    full upper, implants lower     Transfusion: None.   Consultants (if any):   Discharged Condition: Improved  Hospital Course: YONEKO TALERICO is an 74 y.o. female who was admitted 05/23/2024 with a diagnosis of Displaced comminuted split depression fracture, left lateral tibial plateau  and went to the operating room on 05/23/2024 and underwent the above named procedures.    Surgeries: Procedure(s): OPEN REDUCTION INTERNAL FIXATION (ORIF) TIBIAL  PLATEAU on 05/23/2024 Patient tolerated the surgery well. Taken to PACU where she was stabilized and then transferred to the post op recovery area.  Started on Lovenox  30mg  q 24 hrs. Heels elevated on bed with rolled towels. No evidence of DVT. Negative Homan. Physical therapy started on day #0 for gait training and transfer. OT started day #1 for ADL and assisted devices.  Patient's IV was removed on POD1.  Implants: Biomet 3-hole precontoured lateral tibial plateau plate   She was given perioperative antibiotics:  Anti-infectives (From admission, onward)    Start     Dose/Rate Route Frequency Ordered Stop   05/23/24 1600  ceFAZolin  (ANCEF ) IVPB 2g/100 mL premix        2 g 200 mL/hr over 30 Minutes Intravenous Every 6 hours 05/23/24 1205 05/23/24 2244   05/23/24 0830  ceFAZolin  (ANCEF ) IVPB 2g/100 mL premix        2 g 200 mL/hr over 30 Minutes Intravenous On call to O.R. 05/23/24 9171 05/23/24 1047     .  She was given sequential compression devices, early ambulation, and Lovenox  for DVT prophylaxis.  She benefited maximally from the hospital stay and there were no complications.    Recent vital signs:  Vitals:   05/24/24 0249 05/24/24 0718  BP: (!) 141/69 125/73  Pulse:  96  Resp: 16 17  Temp: (!) 97.3 F (36.3 C) 97.8 F (36.6 C)  SpO2: 94% 96%    Recent laboratory studies:  Lab Results  Component Value Date   HGB 13.9 05/23/2024   HGB 11.5 (L) 05/19/2024   Lab Results  Component Value Date   WBC  4.6 05/19/2024   PLT 657 (H) 05/19/2024   No results found for: INR Lab Results  Component Value Date   NA 129 (L) 05/23/2024   K 3.6 05/23/2024   CL 93 (L) 05/23/2024   CO2 27 05/19/2024   BUN 5 (L) 05/23/2024   CREATININE 0.50 05/23/2024   GLUCOSE 93 05/23/2024    Discharge Medications:   Allergies as of 05/24/2024       Reactions   Accuretic [quinapril-hydrochlorothiazide]    Potassium dropped   Nickel Rash        Medication List     TAKE these  medications    acetaminophen  650 MG CR tablet Commonly known as: TYLENOL  Take 1,300 mg by mouth every 8 (eight) hours as needed for pain or fever.   Advil 200 MG tablet Generic drug: ibuprofen Take 600 mg by mouth every 6 (six) hours as needed for mild pain (pain score 1-3) or moderate pain (pain score 4-6).   amLODipine  5 MG tablet Commonly known as: NORVASC  Take 5 mg by mouth daily.   esomeprazole 20 MG capsule Commonly known as: NEXIUM Take 20 mg by mouth daily at 12 noon.   HYDROcodone -acetaminophen  5-325 MG tablet Commonly known as: NORCO/VICODIN Take 0.5-1 tablets by mouth every 4 (four) hours as needed for severe pain (pain score 7-10) or moderate pain (pain score 4-6).   losartan  100 MG tablet Commonly known as: COZAAR  Take 100 mg by mouth daily.   lovastatin 40 MG tablet Commonly known as: MEVACOR Take 40 mg by mouth at bedtime.   magnesium  oxide 400 MG tablet Commonly known as: MAG-OX Take 1 tablet (400 mg total) by mouth daily.   Melatonin 10 MG Tabs Take 10 mg by mouth at bedtime.   potassium chloride  SA 20 MEQ tablet Commonly known as: KLOR-CON  M Take 2 tablets (40 mEq) today. On Saturday and Sunday, take 1 tablet (20 mEq) twice a day. Take 1 tablet (20 mEq) before coming in for surgery on Monday. Follow up with PCP for repeat labs.   PRESERVISION AREDS 2 PO Take 1 tablet by mouth daily.   propranolol  ER 60 MG 24 hr capsule Commonly known as: INDERAL  LA Take 60 mg by mouth daily.   traZODone  50 MG tablet Commonly known as: DESYREL  Take 50 mg by mouth at bedtime.   Tussionex Pennkinetic  ER 10-8 MG/5ML Suer Generic drug: chlorpheniramine-HYDROcodone  Take 5 mLs by mouth daily.   vitamin B-12 500 MCG tablet Commonly known as: CYANOCOBALAMIN  Take 500 mcg by mouth daily.        Diagnostic Studies: DG Tibia/Fibula Left Result Date: 05/23/2024 CLINICAL DATA:  Elective surgery. EXAM: LEFT TIBIA AND FIBULA - 2 VIEW COMPARISON:  Preoperative imaging  FINDINGS: Three fluoroscopic spot views of the left proximal tibia and fibula submitted from the operating room. Lateral plate and screw fixation of proximal tibial fracture. Fluoroscopy time 41.5 seconds. Dose 2.24 mGy. IMPRESSION: Intraoperative fluoroscopy during proximal tibial fracture ORIF. Electronically Signed   By: Andrea Gasman M.D.   On: 05/23/2024 13:45   DG C-Arm 1-60 Min-No Report Result Date: 05/23/2024 Fluoroscopy was utilized by the requesting physician.  No radiographic interpretation.   CT Knee Left Wo Contrast Result Date: 05/10/2024 CLINICAL DATA:  Tibial plateau fracture EXAM: CT OF THE LEFT KNEE WITHOUT CONTRAST TECHNIQUE: Multidetector CT imaging of the left knee was performed according to the standard protocol. Multiplanar CT image reconstructions were also generated. RADIATION DOSE REDUCTION: This exam was performed according to the departmental dose-optimization  program which includes automated exposure control, adjustment of the mA and/or kV according to patient size and/or use of iterative reconstruction technique. COMPARISON:  Knee x-ray 05/10/2024 FINDINGS: Bones/Joint/Cartilage Large knee effusion with hematocrit level consistent with hemarthrosis. No dislocation. Acute mildly comminuted intra-articular fracture involving the lateral tibial plateau with approximately 5.6 mm of depression. Fracture extends to the lateral tibial spine. Suspect faint vertically oriented fracture lucency/possible cleavage component extending to the metaphysis with cortical breach, series 11, image 26, and sagittal series 13, image 30. No medial plateau fracture. Joint space calcifications consistent with chondrocalcinosis. Mild medial tibiofemoral joint space narrowing. Minimal superior patellar spurring. Ligaments Suboptimally assessed by CT. Muscles and Tendons No intramuscular fluid collections.  No significant atrophy. Soft tissues Vascular calcifications. Edema within the subcutaneous soft  tissues. IMPRESSION: 1. Acute mildly comminuted intra-articular fracture involving the lateral tibial plateau with approximately 5.6 mm of depression. Associated large lipohemarthrosis. 2. Chondrocalcinosis. Electronically Signed   By: Luke Bun M.D.   On: 05/10/2024 21:54   DG Knee Complete 4 Views Left Result Date: 05/10/2024 CLINICAL DATA:  Trying to get luggage off conveyor belt. Fell and injured left knee. Cannot straighten out left leg EXAM: LEFT KNEE - COMPLETE 4+ VIEW COMPARISON:  None Available. FINDINGS: Moderate lipohemarthrosis. Acute depressed fracture of the lateral tibial plateau. Fracture lines extend into the intercondylar tubercles. No definite extension into the medial tibial plateau or tibial diaphysis. IMPRESSION: Acute depressed fracture of the lateral tibial plateau. Moderate lipohemarthrosis Electronically Signed   By: Norman Gatlin M.D.   On: 05/10/2024 20:33   Disposition: Plan for discharge home today pending progress with PT.    Follow-up Information     Poggi, Norleen PARAS, MD Follow up.   Specialty: Orthopedic Surgery Why: follow up scheduled with Fonda PA on 9/12 @ 0915am follow up scheduled with Dr. Edie 10/17 @ 10:15am Contact information: [address] [facility] [place] [address] [phone]                 Signed: Lynwood LITTIE Level PA-C 05/24/2024, 7:59 AM

## 2024-05-24 NOTE — Evaluation (Signed)
 Occupational Therapy Evaluation Patient Details Name: Lindsey Mccall MRN: [id number] DOB: 05/11/1951 Today's Date: 05/24/2024   History of Present Illness   Pt is a 73 yo F s/p mechanical fall and diagnosed with displaced comminuted split depression fracture of the left lateral tibial plateau now s/p ORIF 05/23/24.  PMH incudes: anemia, breast CA, COPD, chronic cough, HTN, and hyponatremia.   Clinical Impressions Lindsey Mccall was seen for OT evaluation this date. Prior to hospital admission, pt was IND including traveling. Pt lives alone, plan to stay with son and daughter in law. Pt currently requires CGA + RW for toilet t/f, IND pericare sitting. MAX A don R shoe in sitting and adjust L knee brace. Educated on [employer], polar care, dressing techniques. All education complete, will sign off. Upon hospital discharge, recommend no OT follow up.     If plan is discharge home, recommend the following:   A little help with walking and/or transfers;A little help with bathing/dressing/bathroom     Functional Status Assessment   Patient has had a recent decline in their functional status and demonstrates the ability to make significant improvements in function in a reasonable and predictable amount of time.     Equipment Recommendations   BSC/3in1     Recommendations for Other Services         Precautions/Restrictions   Precautions Precautions: Fall Recall of Precautions/Restrictions: Intact Required Braces or Orthoses: Knee Immobilizer - Left Knee Immobilizer - Left: On at all times Restrictions Weight Bearing Restrictions Per Provider Order: Yes LLE Weight Bearing Per Provider Order: Non weight bearing Other Position/Activity Restrictions: Patient may unlock hinges for active and passive knee range of motion exercises, but hinges need to be locked in extension for ambulation     Mobility Bed Mobility Overal bed mobility: Modified Independent                   Transfers Overall transfer level: Needs assistance Equipment used: Rolling walker (2 wheels) Transfers: Sit to/from Stand Sit to Stand: Contact guard assist                  Balance Overall balance assessment: Needs assistance, History of Falls Sitting-balance support: No upper extremity supported Sitting balance-Leahy Scale: Normal     Standing balance support: Single extremity supported, During functional activity Standing balance-Leahy Scale: Good                             ADL either performed or assessed with clinical judgement   ADL Overall ADL's : Needs assistance/impaired                                       General ADL Comments: CGA + RW for toilet t/f, IND pericare sitting. MAX A don R shoe in sitting and adjust L knee brace     Vision         Perception         Praxis         Pertinent Vitals/Pain Pain Assessment Pain Assessment: 0-10 Pain Score: 5  Pain Location: L knee Pain Descriptors / Indicators: Sore Pain Intervention(s): Limited activity within patient's tolerance, Patient requesting pain meds-RN notified     Extremity/Trunk Assessment Upper Extremity Assessment Upper Extremity Assessment: Overall WFL for tasks assessed   Lower Extremity Assessment Lower Extremity Assessment: Overall [facility]  for tasks assessed       Communication Communication Communication: No apparent difficulties   Cognition Arousal: Alert Behavior During Therapy: WFL for tasks assessed/performed Cognition: No apparent impairments                               Following commands: Intact       Cueing  General Comments          Exercises     Shoulder Instructions      Home Living Family/patient expects to be discharged to:: Private residence Living Arrangements: Children Available Help at Discharge: Family;Available 24 hours/day;Available PRN/intermittently Type of Home: House Home Access: Stairs to  enter [employer] of Steps: 1 Entrance Stairs-Rails: Left Home Layout: Two level;Able to live on main level with bedroom/bathroom               Home Equipment: BSC/3in1;Rolling Walker (2 wheels);Rollator (4 wheels);Wheelchair - manual;Crutches   Additional Comments: Pt to discharge home to son's house.  Son and daughter-in-law available 24/7 initially upon discharge until the weekend then intermittent assist      Prior Functioning/Environment Prior Level of Function : Independent/Modified Independent;History of Falls (last six months)             Mobility Comments: Ind amb community distances without an AD, active with walking and shopping, no other fall history ADLs Comments: Ind with ADLs    OT Problem List: Decreased range of motion;Decreased activity tolerance   OT Treatment/Interventions:        OT Goals(Current goals can be found in the care plan section)   Acute Rehab OT Goals Patient Stated Goal: to go home OT Goal Formulation: With patient Time For Goal Achievement: 05/24/24 Potential to Achieve Goals: Good   OT Frequency:       Co-evaluation              AM-PAC OT 6 Clicks Daily Activity     Outcome Measure Help from another person eating meals?: None Help from another person taking care of personal grooming?: A Little Help from another person toileting, which includes using toliet, bedpan, or urinal?: A Little Help from another person bathing (including washing, rinsing, drying)?: A Lot Help from another person to put on and taking off regular upper body clothing?: A Little Help from another person to put on and taking off regular lower body clothing?: A Lot 6 Click Score: 17   End of Session Equipment Utilized During Treatment: Rolling walker (2 wheels) Nurse Communication: Patient requests pain meds  Activity Tolerance: Patient tolerated treatment well Patient left: in chair;with call bell/phone within reach  OT Visit  Diagnosis: Muscle weakness (generalized) (M62.81)                Time: 9166-9145 OT Time Calculation (min): 21 min Charges:  OT General Charges $OT Visit: 1 Visit OT Evaluation $OT Eval Low Complexity: 1 Low OT Treatments $Self Care/Home Management : 8-22 mins  Elston Slot, M.S. OTR/L  05/24/24, 9:50 AM  ascom [phone]

## 2024-05-24 NOTE — Progress Notes (Signed)
 DISCHARGE NOTE:   Pt dc with IV removed and dc instructions given. Pt has polar care and received a 3 in 1 as well as a RW to hospital room. Pt voices no questions or concerns at this time. Pt wheeled down to medical mall entrance by staff and pt's son provided transportation.

## 2024-05-24 NOTE — Progress Notes (Signed)
 Physical Therapy Treatment Patient Details Name: Lindsey Mccall MRN: [id number] DOB: Sep 17, 1951 Today's Date: 05/24/2024   History of Present Illness Pt is a 73 yo F s/p mechanical fall and diagnosed with displaced comminuted split depression fracture of the left lateral tibial plateau now s/p ORIF 05/23/24.  PMH incudes: anemia, breast CA, COPD, chronic cough, HTN, and hyponatremia.    PT Comments  Pt was pleasant and did well with all aspects of mobility.  She showed good confidence with transitions to sitting and maintained NWB very well t/o session.  We reviewed multiple ways to negotiate the steps and ultimately she did well with retro strategy and was able to do single step multiple times t/o direct assist.  Pt with good overall confidence with short distance ambulation and mobility, managed steps well, all questions answered.  Continue with POC per protocol.      If plan is discharge home, recommend the following: A little help with walking and/or transfers;A little help with bathing/dressing/bathroom;Assistance with cooking/housework;Assist for transportation;Help with stairs or ramp for entrance   Can travel by private vehicle        Equipment Recommendations  Rolling walker (2 wheels);BSC/3in1    Recommendations for Other Services       Precautions / Restrictions Precautions Precautions: Fall Recall of Precautions/Restrictions: Intact Required Braces or Orthoses: Knee Immobilizer - Left Knee Immobilizer - Left: On at all times Restrictions Weight Bearing Restrictions Per Provider Order: Yes LLE Weight Bearing Per Provider Order: Non weight bearing Other Position/Activity Restrictions: Patient may unlock hinges for active and passive knee range of motion exercises, but hinges need to be locked in extension for ambulation     Mobility  Bed Mobility               General bed mobility comments: in recliner pre/post sessionq    Transfers Overall transfer level: Modified  independent Equipment used: Rolling walker (2 wheels) Transfers: Sit to/from Stand Sit to Stand: Supervision           General transfer comment: minimal cuing for set up, good confidence    Ambulation/Gait Ambulation/Gait assistance: Contact guard assist Gait Distance (Feet): 35 Feet Assistive device: Rolling walker (2 wheels)         General Gait Details: Pt showed good control and use of UEs during hop-to gait.  She was able to confidently keep weight off the L well.  She did endorse some fatigue UEs as much as any.   Stairs Stairs: Yes Stairs assistance: Supervision Stair Management: Backwards, With walker Number of Stairs: 1 General stair comments: multiple times up and down single step, pt showed good control with good UE strength using retro strategy   Wheelchair Mobility     Tilt Bed    Modified Rankin (Stroke Patients Only)       Balance Overall balance assessment: Needs assistance, History of Falls Sitting-balance support: No upper extremity supported Sitting balance-Leahy Scale: Normal     Standing balance support: Bilateral upper extremity supported Standing balance-Leahy Scale: Good                              Communication Communication Communication: No apparent difficulties  Cognition Arousal: Alert Behavior During Therapy: WFL for tasks assessed/performed                             Following commands: Intact  Cueing    Exercises Other Exercises Other Exercises: reviewed gentle exercises with focus on AP and QS    General Comments        Pertinent Vitals/Pain Pain Assessment Pain Assessment: 0-10 Pain Score: 2  Pain Location: L knee    Home Living Family/patient expects to be discharged to:: Private residence Living Arrangements: Children Available Help at Discharge: Family;Available 24 hours/day;Available PRN/intermittently Type of Home: House Home Access: Stairs to enter Entrance  Stairs-Rails: Left Entrance Stairs-Number of Steps: 1   Home Layout: Two level;Able to live on main level with bedroom/bathroom Home Equipment: BSC/3in1;Rolling Walker (2 wheels);Rollator (4 wheels);Wheelchair - manual;Crutches Additional Comments: Pt to discharge home to son's house.  Son and daughter-in-law available 24/7 initially upon discharge until the weekend then intermittent assist    Prior Function            PT Goals (current goals can now be found in the care plan section) Progress towards PT goals: Progressing toward goals    Frequency    BID      PT Plan      Co-evaluation              AM-PAC PT 6 Clicks Mobility   Outcome Measure  Help needed turning from your back to your side while in a flat bed without using bedrails?: None Help needed moving from lying on your back to sitting on the side of a flat bed without using bedrails?: None Help needed moving to and from a bed to a chair (including a wheelchair)?: A Little Help needed standing up from a chair using your arms (e.g., wheelchair or bedside chair)?: None Help needed to walk in hospital room?: A Little Help needed climbing 3-5 steps with a railing? : A Little 6 Click Score: 21    End of Session Equipment Utilized During Treatment: Gait belt Activity Tolerance: Patient tolerated treatment well Patient left: in chair;with call bell/phone within reach Nurse Communication: Mobility status;Weight bearing status PT Visit Diagnosis: History of falling (Z91.81);Other abnormalities of gait and mobility (R26.89);Muscle weakness (generalized) (M62.81);Pain Pain - Right/Left: Left Pain - part of body: Knee     Time: 0926-0958 PT Time Calculation (min) (ACUTE ONLY): 32 min  Charges:    $Gait Training: 8-22 mins $Therapeutic Activity: 8-22 mins PT General Charges $$ ACUTE PT VISIT: 1 Visit                     Carmin JONELLE Deed, DPT 05/24/2024, 10:56 AM

## 2024-05-24 NOTE — Plan of Care (Signed)
  Problem: Health Behavior/Discharge Planning: Goal: Ability to manage health-related needs will improve Outcome: Progressing   Problem: Clinical Measurements: Goal: Ability to maintain clinical measurements within normal limits will improve Outcome: Progressing   Problem: Activity: Goal: Risk for activity intolerance will decrease Outcome: Progressing   Problem: Nutrition: Goal: Adequate nutrition will be maintained Outcome: Progressing   Problem: Coping: Goal: Level of anxiety will decrease Outcome: Progressing   Problem: Pain Managment: Goal: General experience of comfort will improve and/or be controlled Outcome: Progressing

## 2024-05-24 NOTE — TOC Transition Note (Signed)
 Transition of Care [facility]) - Discharge Note   Patient Details  Name: Lindsey Mccall MRN: [id number] Date of Birth: 12/20/1950  Transition of [facility] CM/SW Contact:  Marinda Cooks, RN Phone Number: 05/24/2024, 10:03 AM   Clinical Narrative:    This CM updated by covering MD pt medically cleared to dc today and has active DC order . This CM spoke with pt introduced role and discussed DME & HH recommendation with pt and provided choice. Pt expressed she would like to have referral sent to [facility] . This CM sent referral and confirmed it was received and DME would be delivered to pt's rm. Pt informed she did not have a agency preference for [facility] . This CM confirmed HH referral with [facility] . DC transportation confirmed for pt with family .Medical team updated . No additional DC needs requested by medical team or identified by CM at this time .     Final next level of care: Home w Home Health Services Barriers to Discharge: No Barriers Identified    Name of family member notified: Pt Patient and family notified of of transfer: 05/24/24  Discharge Plan and Services Additional resources added to the After Visit Summary for                  DME Arranged: 3-N-1, Walker rolling DME Agency: [facility]       HH Arranged: PT, OT HH Agency: [facility] Date [facility] Agency Contacted: 05/24/24 Time HH Agency Contacted: 1001 Representative spoke with at [facility] Agency: Darleene  Social Drivers of Health (SDOH) Interventions SDOH Screenings   Food Insecurity: No Food Insecurity (05/23/2024)  Housing: Low Risk  (05/23/2024)  Transportation Needs: No Transportation Needs (05/23/2024)  Utilities: Not At Risk (05/23/2024)  Financial Resource Strain: Low Risk  (02/18/2024)   Received from [facility]  Social Connections: Moderately Integrated (05/23/2024)  Tobacco Use: Medium Risk (05/23/2024)     Readmission Risk Interventions     No data to display

## 2024-05-24 NOTE — Anesthesia Postprocedure Evaluation (Signed)
 Anesthesia Post Note  Patient: LEZETTE KITTS  Procedure(s) Performed: OPEN REDUCTION INTERNAL FIXATION (ORIF) TIBIAL PLATEAU (Left)  Patient location during evaluation: PACU Anesthesia Type: General Level of consciousness: awake and alert Pain management: pain level controlled Vital Signs Assessment: post-procedure vital signs reviewed and stable Respiratory status: spontaneous breathing, nonlabored ventilation, respiratory function stable and patient connected to nasal cannula oxygen Cardiovascular status: blood pressure returned to baseline and stable Postop Assessment: no apparent nausea or vomiting Anesthetic complications: no   No notable events documented.   Last Vitals:  Vitals:   05/24/24 0249 05/24/24 0718  BP: (!) 141/69 125/73  Pulse:  96  Resp: 16 17  Temp: (!) 36.3 C 36.6 C  SpO2: 94% 96%    Last Pain:  Vitals:   05/24/24 0718  TempSrc: Oral  PainSc: 1     LLE Motor Response: Purposeful movement (05/24/24 0838) LLE Sensation: Full sensation (05/24/24 0838) RLE Motor Response: Purposeful movement (05/24/24 0838) RLE Sensation: Full sensation (05/24/24 9161)      Prentice Murphy

## 2024-05-24 NOTE — TOC CM/SW Note (Signed)
 Patient is not able to walk the distance required to go the bathroom, or he/she is unable to safely negotiate stairs required to access the bathroom.  A 3in1 BSC will alleviate this problem

## 2024-05-29 ENCOUNTER — Encounter: Admission: RE | Payer: Self-pay | Source: Home / Self Care

## 2024-05-29 ENCOUNTER — Ambulatory Visit: Admission: RE | Admit: 2024-05-29 | Source: Home / Self Care | Admitting: Gastroenterology

## 2024-05-29 SURGERY — COLONOSCOPY
Anesthesia: General

## 2024-06-02 DIAGNOSIS — M25562 Pain in left knee: Secondary | ICD-10-CM | POA: Diagnosis not present

## 2024-07-07 DIAGNOSIS — Z9889 Other specified postprocedural states: Secondary | ICD-10-CM | POA: Diagnosis not present

## 2024-07-07 DIAGNOSIS — Z8781 Personal history of (healed) traumatic fracture: Secondary | ICD-10-CM | POA: Diagnosis not present

## 2024-07-25 DIAGNOSIS — R262 Difficulty in walking, not elsewhere classified: Secondary | ICD-10-CM | POA: Diagnosis not present

## 2024-07-25 DIAGNOSIS — M25562 Pain in left knee: Secondary | ICD-10-CM | POA: Diagnosis not present

## 2024-07-25 DIAGNOSIS — Z9889 Other specified postprocedural states: Secondary | ICD-10-CM | POA: Diagnosis not present

## 2024-07-25 DIAGNOSIS — Z8781 Personal history of (healed) traumatic fracture: Secondary | ICD-10-CM | POA: Diagnosis not present

## 2024-07-25 DIAGNOSIS — S82142D Displaced bicondylar fracture of left tibia, subsequent encounter for closed fracture with routine healing: Secondary | ICD-10-CM | POA: Diagnosis not present

## 2024-07-27 DIAGNOSIS — Z79899 Other long term (current) drug therapy: Secondary | ICD-10-CM | POA: Diagnosis not present

## 2024-07-27 DIAGNOSIS — I10 Essential (primary) hypertension: Secondary | ICD-10-CM | POA: Diagnosis not present

## 2024-07-27 DIAGNOSIS — R053 Chronic cough: Secondary | ICD-10-CM | POA: Diagnosis not present

## 2024-07-27 DIAGNOSIS — E7800 Pure hypercholesterolemia, unspecified: Secondary | ICD-10-CM | POA: Diagnosis not present

## 2024-07-27 DIAGNOSIS — D649 Anemia, unspecified: Secondary | ICD-10-CM | POA: Diagnosis not present

## 2024-08-10 DIAGNOSIS — Z79899 Other long term (current) drug therapy: Secondary | ICD-10-CM | POA: Diagnosis not present
